# Patient Record
Sex: Male | Born: 1975 | Race: Black or African American | Hispanic: No | Marital: Married | Smoking: Current some day smoker
Health system: Southern US, Community
[De-identification: ages and names within clinical notes are randomized; demographics above are authoritative.]

---

## 2003-06-18 ENCOUNTER — Emergency Department (HOSPITAL_COMMUNITY): Admission: EM | Admit: 2003-06-18 | Discharge: 2003-06-18 | Payer: Self-pay | Admitting: Emergency Medicine

## 2006-03-17 ENCOUNTER — Emergency Department (HOSPITAL_COMMUNITY): Admission: EM | Admit: 2006-03-17 | Discharge: 2006-03-17 | Payer: Self-pay | Admitting: Emergency Medicine

## 2010-05-20 ENCOUNTER — Ambulatory Visit (HOSPITAL_COMMUNITY)
Admission: RE | Admit: 2010-05-20 | Discharge: 2010-05-20 | Payer: Self-pay | Source: Home / Self Care | Attending: Emergency Medicine | Admitting: Emergency Medicine

## 2010-05-20 ENCOUNTER — Emergency Department (HOSPITAL_COMMUNITY)
Admission: EM | Admit: 2010-05-20 | Discharge: 2010-05-20 | Payer: Self-pay | Source: Home / Self Care | Admitting: Emergency Medicine

## 2016-06-24 ENCOUNTER — Encounter: Payer: Self-pay | Admitting: Family Medicine

## 2016-06-24 ENCOUNTER — Ambulatory Visit (INDEPENDENT_AMBULATORY_CARE_PROVIDER_SITE_OTHER): Payer: No Typology Code available for payment source | Admitting: Family Medicine

## 2016-06-24 VITALS — BP 140/72 | HR 86 | Temp 98.8°F | Resp 14 | Ht 70.0 in | Wt 266.0 lb

## 2016-06-24 DIAGNOSIS — E6609 Other obesity due to excess calories: Secondary | ICD-10-CM

## 2016-06-24 DIAGNOSIS — Z720 Tobacco use: Secondary | ICD-10-CM | POA: Diagnosis not present

## 2016-06-24 DIAGNOSIS — Z0000 Encounter for general adult medical examination without abnormal findings: Secondary | ICD-10-CM | POA: Diagnosis not present

## 2016-06-24 DIAGNOSIS — E669 Obesity, unspecified: Secondary | ICD-10-CM | POA: Insufficient documentation

## 2016-06-24 DIAGNOSIS — R030 Elevated blood-pressure reading, without diagnosis of hypertension: Secondary | ICD-10-CM | POA: Diagnosis not present

## 2016-06-24 DIAGNOSIS — Z6838 Body mass index (BMI) 38.0-38.9, adult: Secondary | ICD-10-CM

## 2016-06-24 DIAGNOSIS — Z23 Encounter for immunization: Secondary | ICD-10-CM

## 2016-06-24 DIAGNOSIS — Z6839 Body mass index (BMI) 39.0-39.9, adult: Secondary | ICD-10-CM

## 2016-06-24 MED ORDER — VARENICLINE TARTRATE 0.5 MG X 11 & 1 MG X 42 PO MISC
ORAL | 0 refills | Status: DC
Start: 2016-06-24 — End: 2016-06-24

## 2016-06-24 MED ORDER — VARENICLINE TARTRATE 0.5 MG X 11 & 1 MG X 42 PO MISC: ORAL | 0 refills | Status: DC

## 2016-06-24 NOTE — Patient Instructions (Signed)
TDAP given Start chantix

## 2016-06-24 NOTE — Assessment & Plan Note (Signed)
On tobacco cessation will start Chantix he is to set a quit date within the next 3 months we will follow-up in 3 months to see how he is doing.

## 2016-06-24 NOTE — Progress Notes (Signed)
   Subjective:    Patient ID: Chad Moyer, male    DOB: Jun 22, 1975, 41 y.o.   MRN: [id number]  Patient presents for New Patient CPE (is not fasting)   Retired from [employer], now works [employer] Patient here for new patient physical. He has not been seen by Dr. about 13 years when he retired from [employer]. He has never been diagnosed with any medical problems. He does smoke which is his main concern today about 5 cigarettes a day he isn't smoking for the past 12-13 years. He's also concerned about it with his weight and he states that he eats whatever his wife brings home but is often fried food or feeds with a lot of carbs such as potatoes. He has not been exercising. He works 12 AM to 12 PM which is a difficult schedule typically 5 days a week. His family history was reviewed there is no history of cardiac disease. Only cancer history of his mother who had breast cancer.  Immunizations he is due for a tetanus booster. He declines flu shot.    Review Of Systems:  GEN- denies fatigue, fever, weight loss,weakness, recent illness HEENT- denies eye drainage, change in vision, nasal discharge, CVS- denies chest pain, palpitations RESP- denies SOB, cough, wheeze ABD- denies N/V, change in stools, abd pain GU- denies dysuria, hematuria, dribbling, incontinence MSK- denies joint pain, muscle aches, injury Neuro- denies headache, dizziness, syncope, seizure activity       Objective:    BP 140/72   Pulse 86   Temp 98.8 F (37.1 C) (Oral)   Resp 14   Ht 5\' 10"  (1.778 m)   Wt 266 lb (120.7 kg)   SpO2 96%   BMI 38.17 kg/m  GEN- NAD, alert and oriented x3 HEENT- PERRL, EOMI, non injected sclera, pink conjunctiva, MMM, oropharynx clear Neck- Supple, no thyromegaly CVS- RRR, no murmur RESP-CTAB ABD-NABS,soft,NT,ND Psych- normal affect and mood  EXT- No edema Pulses- Radial, DP- 2+        Assessment & Plan:      Problem List Items Addressed This Visit    Tobacco use    On tobacco cessation will start Chantix he is to set a quit date within the next 3 months we will follow-up in 3 months to see how he is doing.      Obese   Relevant Orders   Lipid panel   Elevated blood pressure reading    Blood pressure is little elevated we'll have to check on this at home. We also discussed the implications from his weight. His fasting labs will be done to make sure he does not have any metabolic disorders contributing to this as well. No medications will be started today.      Relevant Orders   Comprehensive metabolic panel    Other Visit Diagnoses    Routine general medical examination at a health care facility    -  Primary   CPE done, TDAP given. Fasting labs   Relevant Orders   CBC with Differential/Platelet   Comprehensive metabolic panel   Lipid panel      Note: This dictation was prepared with Dragon dictation along with smaller phrase technology. Any transcriptional errors that result from this process are unintentional.

## 2016-06-24 NOTE — Assessment & Plan Note (Signed)
Blood pressure is little elevated we'll have to check on this at home. We also discussed the implications from his weight. His fasting labs will be done to make sure he does not have any metabolic disorders contributing to this as well. No medications will be started today.

## 2016-06-25 LAB — COMPREHENSIVE METABOLIC PANEL
ALBUMIN: 4.4 g/dL (ref 3.6–5.1)
ALT: 28 U/L (ref 9–46)
AST: 25 U/L (ref 10–40)
Alkaline Phosphatase: 55 U/L (ref 40–115)
BILIRUBIN TOTAL: 0.4 mg/dL (ref 0.2–1.2)
BUN: 12 mg/dL (ref 7–25)
CHLORIDE: 105 mmol/L (ref 98–110)
CO2: 25 mmol/L (ref 20–31)
CREATININE: 1.24 mg/dL (ref 0.60–1.35)
Calcium: 9.2 mg/dL (ref 8.6–10.3)
Glucose, Bld: 102 mg/dL — ABNORMAL HIGH (ref 70–99)
Potassium: 4.2 mmol/L (ref 3.5–5.3)
SODIUM: 140 mmol/L (ref 135–146)
TOTAL PROTEIN: 6.9 g/dL (ref 6.1–8.1)

## 2016-06-25 LAB — CBC WITH DIFFERENTIAL/PLATELET
Basophils Absolute: 0 cells/uL (ref 0–200)
Basophils Relative: 0 %
EOS PCT: 2 %
Eosinophils Absolute: 148 cells/uL (ref 15–500)
HCT: 43.3 % (ref 38.5–50.0)
HEMOGLOBIN: 14.5 g/dL (ref 13.0–17.0)
LYMPHS ABS: 2368 {cells}/uL (ref 850–3900)
Lymphocytes Relative: 32 %
MCH: 28.2 pg (ref 27.0–33.0)
MCHC: 33.5 g/dL (ref 32.0–36.0)
MCV: 84.1 fL (ref 80.0–100.0)
MONOS PCT: 10 %
MPV: 10 fL (ref 7.5–12.5)
Monocytes Absolute: 740 cells/uL (ref 200–950)
NEUTROS ABS: 4144 {cells}/uL (ref 1500–7800)
Neutrophils Relative %: 56 %
PLATELETS: 353 10*3/uL (ref 140–400)
RBC: 5.15 MIL/uL (ref 4.20–5.80)
RDW: 14.9 % (ref 11.0–15.0)
WBC: 7.4 10*3/uL (ref 3.8–10.8)

## 2016-06-25 LAB — LIPID PANEL
CHOLESTEROL: 160 mg/dL (ref <200)
HDL: 38 mg/dL — ABNORMAL LOW (ref >40)
LDL CALC: 90 mg/dL (ref <100)
TRIGLYCERIDES: 159 mg/dL — AB (ref <150)
Total CHOL/HDL Ratio: 4.2 Ratio (ref <5.0)
VLDL: 32 mg/dL — AB (ref <30)

## 2016-07-01 ENCOUNTER — Encounter: Payer: Self-pay | Admitting: *Deleted

## 2016-09-23 ENCOUNTER — Ambulatory Visit: Payer: No Typology Code available for payment source | Admitting: Family Medicine

## 2016-10-04 ENCOUNTER — Encounter: Payer: Self-pay | Admitting: Family Medicine

## 2016-10-04 ENCOUNTER — Ambulatory Visit (INDEPENDENT_AMBULATORY_CARE_PROVIDER_SITE_OTHER): Payer: No Typology Code available for payment source | Admitting: Family Medicine

## 2016-10-04 VITALS — BP 126/64 | HR 66 | Temp 98.6°F | Resp 14 | Ht 70.0 in | Wt 262.0 lb

## 2016-10-04 DIAGNOSIS — Z720 Tobacco use: Secondary | ICD-10-CM | POA: Diagnosis not present

## 2016-10-04 DIAGNOSIS — R7301 Impaired fasting glucose: Secondary | ICD-10-CM | POA: Diagnosis not present

## 2016-10-04 DIAGNOSIS — E6609 Other obesity due to excess calories: Secondary | ICD-10-CM | POA: Diagnosis not present

## 2016-10-04 DIAGNOSIS — E781 Pure hyperglyceridemia: Secondary | ICD-10-CM | POA: Diagnosis not present

## 2016-10-04 DIAGNOSIS — Z6837 Body mass index (BMI) 37.0-37.9, adult: Secondary | ICD-10-CM

## 2016-10-04 LAB — LIPID PANEL
CHOL/HDL RATIO: 4 ratio (ref <5.0)
CHOLESTEROL: 153 mg/dL (ref <200)
HDL: 38 mg/dL — ABNORMAL LOW (ref >40)
LDL CALC: 78 mg/dL (ref <100)
Triglycerides: 183 mg/dL — ABNORMAL HIGH (ref <150)
VLDL: 37 mg/dL — AB (ref <30)

## 2016-10-04 LAB — COMPREHENSIVE METABOLIC PANEL
ALBUMIN: 4.5 g/dL (ref 3.6–5.1)
ALT: 33 U/L (ref 9–46)
AST: 28 U/L (ref 10–40)
Alkaline Phosphatase: 60 U/L (ref 40–115)
BILIRUBIN TOTAL: 0.3 mg/dL (ref 0.2–1.2)
BUN: 11 mg/dL (ref 7–25)
CHLORIDE: 106 mmol/L (ref 98–110)
CO2: 20 mmol/L (ref 20–31)
Calcium: 9.6 mg/dL (ref 8.6–10.3)
Creat: 1.09 mg/dL (ref 0.60–1.35)
Glucose, Bld: 93 mg/dL (ref 70–99)
POTASSIUM: 4.6 mmol/L (ref 3.5–5.3)
Sodium: 141 mmol/L (ref 135–146)
TOTAL PROTEIN: 7.1 g/dL (ref 6.1–8.1)

## 2016-10-04 MED ORDER — VARENICLINE TARTRATE 1 MG PO TABS: 1.0000 mg | ORAL_TABLET | Freq: Two times a day (BID) | ORAL | 1 refills | Status: DC

## 2016-10-04 NOTE — Assessment & Plan Note (Signed)
Recheck cholesterol and glucose Continue to work on dietary changes and exercise, healthy eating.

## 2016-10-04 NOTE — Patient Instructions (Signed)
F/U pending results   

## 2016-10-04 NOTE — Assessment & Plan Note (Signed)
Significant improvement with Chantix, given continued dose pak 1mg  BID No SE with medicaiton

## 2016-10-04 NOTE — Progress Notes (Signed)
   Subjective:    Patient ID: Chad Moyer, male    DOB: Mar 19, 1976, 41 y.o.   MRN: [id number]  Patient presents for Follow-up (is fasting)     Pt here for F/U, he established care at his visit in Feb.  Blood pressure was elevated at that visit as well as weight.   We also discussed tobacco cessation. Fasting labs at that visit showed elevated TG in setting of his diabetes, here to have that rechecked as well, fasting blood sugar was also a little elevated at 102    Today he is down 4lbs since Feb Tobacco- started on Chantix, down to 2 cig a day, wants to continue with the medication  No new concerns     Review Of Systems:  GEN- denies fatigue, fever, weight loss,weakness, recent illness HEENT- denies eye drainage, change in vision, nasal discharge, CVS- denies chest pain, palpitations RESP- denies SOB, cough, wheeze ABD- denies N/V, change in stools, abd pain GU- denies dysuria, hematuria, dribbling, incontinence MSK- denies joint pain, muscle aches, injury Neuro- denies headache, dizziness, syncope, seizure activity       Objective:    BP 126/64   Pulse 66   Temp 98.6 F (37 C) (Oral)   Resp 14   Ht 5\' 10"  (1.778 m)   Wt 262 lb (118.8 kg)   SpO2 98%   BMI 37.59 kg/m  GEN- NAD, alert and oriented x3,obese HEENT- PERRL, EOMI, non injected sclera, pink conjunctiva, MMM, oropharynx clear CVS- RRR, no murmur RESP-CTAB Pulses- Radial  2+        Assessment & Plan:      Problem List Items Addressed This Visit    Tobacco use    Significant improvement with Chantix, given continued dose pak 1mg  BID No SE with medicaiton      Obese - Primary   Hypertriglyceridemia    Recheck cholesterol and glucose Continue to work on dietary changes and exercise, healthy eating.       Relevant Orders   Comprehensive metabolic panel   Lipid panel    Other Visit Diagnoses    Elevated fasting glucose       Relevant Orders   Hemoglobin A1c      Note: This  dictation was prepared with Dragon dictation along with smaller phrase technology. Any transcriptional errors that result from this process are unintentional.

## 2016-10-05 LAB — HEMOGLOBIN A1C
Hgb A1c MFr Bld: 5.8 % — ABNORMAL HIGH (ref <5.7)
Mean Plasma Glucose: 120 mg/dL

## 2016-10-06 ENCOUNTER — Encounter: Payer: Self-pay | Admitting: *Deleted

## 2018-02-21 ENCOUNTER — Ambulatory Visit (INDEPENDENT_AMBULATORY_CARE_PROVIDER_SITE_OTHER): Payer: No Typology Code available for payment source | Admitting: Physician Assistant

## 2018-02-21 ENCOUNTER — Encounter: Payer: Self-pay | Admitting: Physician Assistant

## 2018-02-21 VITALS — BP 138/70 | HR 85 | Temp 98.4°F | Resp 18 | Ht 69.0 in | Wt 266.8 lb

## 2018-02-21 DIAGNOSIS — E6609 Other obesity due to excess calories: Secondary | ICD-10-CM

## 2018-02-21 DIAGNOSIS — R7303 Prediabetes: Secondary | ICD-10-CM | POA: Insufficient documentation

## 2018-02-21 DIAGNOSIS — E781 Pure hyperglyceridemia: Secondary | ICD-10-CM | POA: Diagnosis not present

## 2018-02-21 DIAGNOSIS — Z720 Tobacco use: Secondary | ICD-10-CM | POA: Diagnosis not present

## 2018-02-21 DIAGNOSIS — Z6839 Body mass index (BMI) 39.0-39.9, adult: Secondary | ICD-10-CM

## 2018-02-21 DIAGNOSIS — R739 Hyperglycemia, unspecified: Secondary | ICD-10-CM | POA: Diagnosis not present

## 2018-02-21 DIAGNOSIS — Z0000 Encounter for general adult medical examination without abnormal findings: Secondary | ICD-10-CM | POA: Diagnosis not present

## 2018-02-21 MED ORDER — BUPROPION HCL ER (SR) 150 MG PO TB12
ORAL_TABLET | ORAL | 2 refills | Status: DC
Start: 2018-02-21 — End: 2018-11-29

## 2018-02-21 NOTE — Progress Notes (Signed)
Patient ID: Chad Moyer MRN: [id number], DOB: 1975-11-13 42 y.o. Date of Encounter: 02/21/2018, 10:02 AM    Chief Complaint: Physical (CPE)  HPI: 41 y.o. y/o male here for CPE.     Dr. Jeanice Lim is out on maternity leave so he is scheduled to see me for his visit today. I have reviewed Dr. Deirdre Peer notes from 06/24/2016 and 10/04/2016. She saw him as a new patient to establish care 06/24/2016. Also had CPE. Prior to that visit had not seen any medical provider for about 13 years.  Issues that have been addressed at these 2 visits: Smoking, obesity, and labs revealed hypertriglyceridemia and mild hyperglycemia.  At visit 06/24/2016 they did discuss his weight and at that visit he reported that "he eats what ever his wife brings home.  Often fried foods or eats a lot of carbohydrates such as potatoes.  Not been exercising.  Asked 12 AM to 12 PM."  Also at visit she prescribed Chantix.  Today patient reports that he is still smoking.  Says that he saw no effect with Chantix.  States that it caused no adverse effects but states that he really did not see that it helped decrease smoking at all.  Is still smoking about the same amount.  Is smoking about 1/2 pack/day.  Today also reviewed that his weight is essentially the same.  Today 266.8 pounds. 06/2016 was 266. 10/04/16 was 262.  Reports that he is still working the same job and still working that same shift 12 AM to 12 PM. However he states that he is sometimes also working at 12 PM to 12 AM shift as 1 of the employees has been out some recently so he has filled in that shift some on days off.  Thing is the same.  He is still smoking.  He has not improved his diet or added any exercise.   Review of Systems: Consitutional: No fever, chills, fatigue, night sweats, lymphadenopathy, or weight changes. Eyes: No visual changes, eye redness, or discharge. ENT/Mouth: Ears: No otalgia, tinnitus, hearing loss, discharge. Nose: No  congestion, rhinorrhea, sinus pain, or epistaxis. Throat: No sore throat, post nasal drip, or teeth pain. Cardiovascular: No CP, palpitations, diaphoresis, DOE, edema, orthopnea, PND. Respiratory: No cough, hemoptysis, SOB, or wheezing. Gastrointestinal: No anorexia, dysphagia, reflux, pain, nausea, vomiting, hematemesis, diarrhea, constipation, BRBPR, or melena. Genitourinary: No dysuria, frequency, urgency, hematuria, incontinence, nocturia, decreased urinary stream, discharge, impotence, or testicular pain/masses. Musculoskeletal: No decreased ROM, myalgias, stiffness, joint swelling, or weakness. Skin: No rash, erythema, lesion changes, pain, warmth, jaundice, or pruritis. Neurological: No headache, dizziness, syncope, seizures, tremors, memory loss, coordination problems, or paresthesias. Psychological: No anxiety, depression, hallucinations, SI/HI. Endocrine: No fatigue, polydipsia, polyphagia, polyuria, or known diabetes. All other systems were reviewed and are otherwise negative.  History reviewed. No pertinent past medical history.   History reviewed. No pertinent surgical history.  Home Meds:  Outpatient Medications Prior to Visit  Medication Sig Dispense Refill  . Multiple Vitamin (MULTIVITAMIN WITH MINERALS) TABS tablet Take 1 tablet by mouth daily.    . varenicline (CHANTIX CONTINUING MONTH PAK) 1 MG tablet Take 1 tablet (1 mg total) by mouth 2 (two) times daily. 60 tablet 1   No facility-administered medications prior to visit.     Allergies: No Known Allergies  Social History   Socioeconomic History  . Marital status: Married    Spouse name: Not on file  . Number of children: Not on file  . Years of education:  Not on file  . Highest education level: Not on file  Occupational History  . Not on file  Social Needs  . Financial resource strain: Not on file  . Food insecurity:    Worry: Not on file    Inability: Not on file  . Transportation needs:    Medical: Not  on file    Non-medical: Not on file  Tobacco Use  . Smoking status: Current Every Day Smoker    Packs/day: 0.25    Years: 12.00    Pack years: 3.00    Types: Cigarettes  . Smokeless tobacco: Never Used  Substance and Sexual Activity  . Alcohol use: Yes    Comment: occasionally  . Drug use: No  . Sexual activity: Yes    Birth control/protection: None  Lifestyle  . Physical activity:    Days per week: Not on file    Minutes per session: Not on file  . Stress: Not on file  Relationships  . Social connections:    Talks on phone: Not on file    Gets together: Not on file    Attends religious service: Not on file    Active member of club or organization: Not on file    Attends meetings of clubs or organizations: Not on file    Relationship status: Not on file  . Intimate partner violence:    Fear of current or ex partner: Not on file    Emotionally abused: Not on file    Physically abused: Not on file    Forced sexual activity: Not on file  Other Topics Concern  . Not on file  Social History Narrative  . Not on file    Family History  Problem Relation Age of Onset  . Cancer Mother        breast  . Stroke Paternal Aunt     Physical Exam: Blood pressure 138/70, pulse 85, temperature 98.4 F (36.9 C), temperature source Oral, resp. rate 18, height 5\' 9"  (1.753 m), weight 121 kg, SpO2 93 %.  General: Obese AAM. Appears in no acute distress. HEENT: Normocephalic, atraumatic. Conjunctiva pink, sclera non-icteric. Pupils 2 mm constricting to 1 mm, round, regular, and equally reactive to light and accomodation. EOMI. Internal auditory canal clear. TMs with good cone of light and without pathology. Nasal mucosa pink. Nares are without discharge. No sinus tenderness. Oral mucosa pink Neck: Supple. Trachea midline. No thyromegaly. Full ROM. No lymphadenopathy. Lungs: Clear to auscultation bilaterally without wheezes, rales, or rhonchi. Breathing is of normal effort and  unlabored. Cardiovascular: RRR with S1 S2. No murmurs, rubs, or gallops. Distal pulses 2+ symmetrically. No carotid or abdominal bruits. Abdomen: Soft, non-tender, non-distended with normoactive bowel sounds. No hepatosplenomegaly or masses. No rebound/guarding. No CVA tenderness. No hernias. Musculoskeletal: Full range of motion and 5/5 strength throughout.  Skin: Warm and moist without erythema, ecchymosis, wounds, or rash. Neuro: A+Ox3. CN II-XII grossly intact. Moves all extremities spontaneously. Full sensation throughout. Normal gait. Psych:  Responds to questions appropriately with a normal affect.   Assessment/Plan:  42 y.o. y/o  male here for CPE  -1. Encounter for preventive health examination A. Screening Labs: - CBC with Differential/Platelet - COMPLETE METABOLIC PANEL WITH GFR - Lipid panel - TSH - Hemoglobin A1c   Immunizations: Flu------------------------- defers flu vaccine Tetanus------ up-to-date.  He was given Tdap here 06/24/2016 Pneumococcal------ Will discuss pneumonia vaccine at next visit.  Given his smoking really should have a Pneumovax 23.    2. Tobacco  use Today I discussed adding Wellbutrin.  He is agreeable to use this.  Hopefully this will curb his appetite and decrease cravings for smoking.  If medication causes any adverse effects then notify us.  Otherwise plan to take this for several months. - buPROPion (WELLBUTRIN SR) 150 MG 12 hr tablet; Take 1 daily for 5 days then increase to taking 1 twice a day.  Dispense: 60 tablet; Refill: 2  3. Hypertriglyceridemia - buPROPion (WELLBUTRIN SR) 150 MG 12 hr tablet; Take 1 daily for 5 days then increase to taking 1 twice a day.  Dispense: 60 tablet; Refill: 2 - COMPLETE METABOLIC PANEL WITH GFR - Lipid panel  4. Hyperglycemia - buPROPion (WELLBUTRIN SR) 150 MG 12 hr tablet; Take 1 daily for 5 days then increase to taking 1 twice a day.  Dispense: 60 tablet; Refill: 2 - COMPLETE METABOLIC PANEL WITH GFR -  Hemoglobin A1c  5. Class 2 obesity due to excess calories without serious comorbidity with body mass index (BMI) of 39.0 to 39.9 in adult - buPROPion (WELLBUTRIN SR) 150 MG 12 hr tablet; Take 1 daily for 5 days then increase to taking 1 twice a day.  Dispense: 60 tablet; Refill: 2     Immunizations: Flu Tetanus Pneumococcal Zostaax Signed:   [address] [place], [place]  02/21/2018 10:02 AM

## 2018-02-22 LAB — CBC WITH DIFFERENTIAL/PLATELET
Basophils Absolute: 29 cells/uL (ref 0–200)
Basophils Relative: 0.4 %
EOS ABS: 158 {cells}/uL (ref 15–500)
Eosinophils Relative: 2.2 %
HEMATOCRIT: 44.4 % (ref 38.5–50.0)
Hemoglobin: 15 g/dL (ref 13.2–17.1)
Lymphs Abs: 2390 cells/uL (ref 850–3900)
MCH: 27.7 pg (ref 27.0–33.0)
MCHC: 33.8 g/dL (ref 32.0–36.0)
MCV: 81.9 fL (ref 80.0–100.0)
MPV: 10.1 fL (ref 7.5–12.5)
Monocytes Relative: 9.2 %
NEUTROS PCT: 55 %
Neutro Abs: 3960 cells/uL (ref 1500–7800)
PLATELETS: 357 10*3/uL (ref 140–400)
RBC: 5.42 10*6/uL (ref 4.20–5.80)
RDW: 14.2 % (ref 11.0–15.0)
TOTAL LYMPHOCYTE: 33.2 %
WBC: 7.2 10*3/uL (ref 3.8–10.8)
WBCMIX: 662 {cells}/uL (ref 200–950)

## 2018-02-22 LAB — COMPLETE METABOLIC PANEL WITH GFR
AG Ratio: 1.8 (calc) (ref 1.0–2.5)
ALBUMIN MSPROF: 4.1 g/dL (ref 3.6–5.1)
ALKALINE PHOSPHATASE (APISO): 62 U/L (ref 40–115)
ALT: 34 U/L (ref 9–46)
AST: 28 U/L (ref 10–40)
BILIRUBIN TOTAL: 0.4 mg/dL (ref 0.2–1.2)
BUN: 10 mg/dL (ref 7–25)
CHLORIDE: 106 mmol/L (ref 98–110)
CO2: 26 mmol/L (ref 20–32)
Calcium: 9.1 mg/dL (ref 8.6–10.3)
Creat: 0.94 mg/dL (ref 0.60–1.35)
GFR, Est African American: 115 mL/min/{1.73_m2} (ref >=60)
GFR, Est Non African American: 100 mL/min/{1.73_m2} (ref >=60)
GLUCOSE: 98 mg/dL (ref 65–99)
Globulin: 2.3 g/dL (calc) (ref 1.9–3.7)
Potassium: 4 mmol/L (ref 3.5–5.3)
Sodium: 139 mmol/L (ref 135–146)
Total Protein: 6.4 g/dL (ref 6.1–8.1)

## 2018-02-22 LAB — TSH: TSH: 1.57 mIU/L (ref 0.40–4.50)

## 2018-02-22 LAB — HEMOGLOBIN A1C
HEMOGLOBIN A1C: 5.9 %{Hb} — AB (ref <5.7)
Mean Plasma Glucose: 123 (calc)
eAG (mmol/L): 6.8 (calc)

## 2018-02-22 LAB — LIPID PANEL
Cholesterol: 164 mg/dL (ref <200)
HDL: 36 mg/dL — AB (ref >40)
LDL CHOLESTEROL (CALC): 108 mg/dL — AB
Non-HDL Cholesterol (Calc): 128 mg/dL (calc) (ref <130)
Total CHOL/HDL Ratio: 4.6 (calc) (ref <5.0)
Triglycerides: 108 mg/dL (ref <150)

## 2018-11-27 ENCOUNTER — Encounter (HOSPITAL_COMMUNITY): Payer: Self-pay

## 2018-11-27 ENCOUNTER — Observation Stay (HOSPITAL_COMMUNITY)
Admission: EM | Admit: 2018-11-27 | Discharge: 2018-11-29 | Disposition: A | Discharge status: Home / Self Care | Payer: PRIVATE HEALTH INSURANCE | Attending: Internal Medicine | Admitting: Internal Medicine

## 2018-11-27 ENCOUNTER — Emergency Department (HOSPITAL_COMMUNITY): Payer: PRIVATE HEALTH INSURANCE

## 2018-11-27 ENCOUNTER — Other Ambulatory Visit: Payer: Self-pay

## 2018-11-27 DIAGNOSIS — R030 Elevated blood-pressure reading, without diagnosis of hypertension: Secondary | ICD-10-CM

## 2018-11-27 DIAGNOSIS — G4482 Headache associated with sexual activity: Secondary | ICD-10-CM | POA: Diagnosis not present

## 2018-11-27 DIAGNOSIS — R51 Headache: Principal | ICD-10-CM | POA: Insufficient documentation

## 2018-11-27 DIAGNOSIS — I776 Arteritis, unspecified: Secondary | ICD-10-CM | POA: Diagnosis not present

## 2018-11-27 DIAGNOSIS — Z823 Family history of stroke: Secondary | ICD-10-CM | POA: Diagnosis not present

## 2018-11-27 DIAGNOSIS — R519 Headache, unspecified: Secondary | ICD-10-CM | POA: Diagnosis present

## 2018-11-27 DIAGNOSIS — F17210 Nicotine dependence, cigarettes, uncomplicated: Secondary | ICD-10-CM | POA: Insufficient documentation

## 2018-11-27 DIAGNOSIS — I6601 Occlusion and stenosis of right middle cerebral artery: Secondary | ICD-10-CM | POA: Insufficient documentation

## 2018-11-27 DIAGNOSIS — G44019 Episodic cluster headache, not intractable: Secondary | ICD-10-CM

## 2018-11-27 DIAGNOSIS — E669 Obesity, unspecified: Secondary | ICD-10-CM | POA: Insufficient documentation

## 2018-11-27 DIAGNOSIS — Z1159 Encounter for screening for other viral diseases: Secondary | ICD-10-CM | POA: Insufficient documentation

## 2018-11-27 DIAGNOSIS — Z6837 Body mass index (BMI) 37.0-37.9, adult: Secondary | ICD-10-CM | POA: Insufficient documentation

## 2018-11-27 LAB — BASIC METABOLIC PANEL
Anion gap: 9 (ref 5–15)
BUN: 12 mg/dL (ref 6–20)
CO2: 23 mmol/L (ref 22–32)
Calcium: 9.1 mg/dL (ref 8.9–10.3)
Chloride: 105 mmol/L (ref 98–111)
Creatinine, Ser: 1.18 mg/dL (ref 0.61–1.24)
GFR calc Af Amer: >60 mL/min (ref >60)
GFR calc non Af Amer: >60 mL/min (ref >60)
Glucose, Bld: 102 mg/dL — ABNORMAL HIGH (ref 70–99)
Potassium: 3.9 mmol/L (ref 3.5–5.1)
Sodium: 137 mmol/L (ref 135–145)

## 2018-11-27 LAB — RAPID URINE DRUG SCREEN, HOSP PERFORMED
Amphetamines: NOT DETECTED
Barbiturates: NOT DETECTED
Benzodiazepines: NOT DETECTED
Cocaine: NOT DETECTED
Opiates: NOT DETECTED
Tetrahydrocannabinol: NOT DETECTED

## 2018-11-27 LAB — CBC WITH DIFFERENTIAL/PLATELET
Abs Immature Granulocytes: 0.02 10*3/uL (ref 0.00–0.07)
Basophils Absolute: 0.1 10*3/uL (ref 0.0–0.1)
Basophils Relative: 1 %
Eosinophils Absolute: 0.1 10*3/uL (ref 0.0–0.5)
Eosinophils Relative: 1 %
HCT: 47.2 % (ref 39.0–52.0)
Hemoglobin: 15.7 g/dL (ref 13.0–17.0)
Immature Granulocytes: 0 %
Lymphocytes Relative: 27 %
Lymphs Abs: 2.4 10*3/uL (ref 0.7–4.0)
MCH: 28.2 pg (ref 26.0–34.0)
MCHC: 33.3 g/dL (ref 30.0–36.0)
MCV: 84.9 fL (ref 80.0–100.0)
Monocytes Absolute: 0.8 10*3/uL (ref 0.1–1.0)
Monocytes Relative: 9 %
Neutro Abs: 5.8 10*3/uL (ref 1.7–7.7)
Neutrophils Relative %: 62 %
Platelets: 404 10*3/uL — ABNORMAL HIGH (ref 150–400)
RBC: 5.56 MIL/uL (ref 4.22–5.81)
RDW: 14.2 % (ref 11.5–15.5)
WBC: 9.2 10*3/uL (ref 4.0–10.5)
nRBC: 0 % (ref 0.0–0.2)

## 2018-11-27 LAB — SARS CORONAVIRUS 2 BY RT PCR (HOSPITAL ORDER, PERFORMED IN ~~LOC~~ HOSPITAL LAB): SARS Coronavirus 2: NEGATIVE

## 2018-11-27 MED ORDER — IOHEXOL 350 MG/ML SOLN
75.0000 mL | Freq: Once | INTRAVENOUS | Status: AC | PRN
  Administered 2018-11-27: 75 mL via INTRAVENOUS

## 2018-11-27 MED ORDER — MAGNESIUM SULFATE 2 GM/50ML IV SOLN
2.0000 g | Freq: Once | INTRAVENOUS | Status: AC
  Administered 2018-11-27: 2 g via INTRAVENOUS
  Filled 2018-11-27: qty 50

## 2018-11-27 MED ORDER — HYDRALAZINE HCL 20 MG/ML IJ SOLN: 10.0000 mg | INTRAMUSCULAR | Status: DC | PRN

## 2018-11-27 NOTE — ED Triage Notes (Signed)
Pt reports headache intermittent since July 9th. Denies any other symptoms. Pt alert, states his pain is about a 1/10.

## 2018-11-27 NOTE — Consult Note (Addendum)
NEURO HOSPITALIST CONSULT NOTE   Requestig physician: Dr. Vanita Panda  Reason for Consult: CTA showing severe multifocal intracranial arterial narrowing following 3rd episode of severe headache  History obtained from:   Patient and Chart     HPI:                                                                                                                                          Chad Moyer is an 43 y.o. male who presented to the ED after having his 3rd severe headache in one week. First headache occurred during coitus on July 9th. Second headache occurred while walking on about the 10th. Today, he was doing nothing in particular when the 3rd severe headache occurred. CTA obtained in the ED showed no subarachnoid blood or aneurysm, but was positive for severe multifocal intracranial arterial narrowing.  Headache was 1/10 on arrival to the ED. He has received IV magnesium sulfate; of note, the headache was resolved prior to initiation of the Mg infusion.   He denies illicit drug use.   He denies weakness, numbness, vision changes, facial droop, difficulty with speech, SOB, CP or any other neurological symptoms except headache, which is now resolved.   History reviewed. No pertinent past medical history.  History reviewed. No pertinent surgical history.  Family History  Problem Relation Age of Onset  . Cancer Mother        breast  . Stroke Paternal Aunt               Social History:  reports that he has been smoking cigarettes. He has a 3.00 pack-year smoking history. He has never used smokeless tobacco. He reports current alcohol use. He reports that he does not use drugs.  No Known Allergies  MEDICATIONS:                                                                                                                     Wellbutrin Tylenol Advil MVI   ROS:  Denies    Blood pressure 137/81, pulse 66, temperature 99.6 F (37.6 C), temperature source Oral, resp. rate 16, SpO2 99 %.   General Examination:                                                                                                       Physical Exam  HEENT-  [place]/AT    Lungs- Respirations unlabored Extremities- No edema  Neurological Examination Mental Status:  Alert, oriented, thought content appropriate.  Speech fluent without evidence of aphasia.  Able to follow all commands without difficulty. Cranial Nerves: II: Visual fields intact with no extinction to DSS. PERRL III,IV, VI: No ptosis. EOMI.  V,VII: No facial droop. Temp sensation equal bilaterally VIII: hearing intact to conversation IX,X: Palate rises symmetrically XI: Symmetric shoulder shrug XII: midline tongue extension Motor: Right : Upper extremity   5/5    Left:     Upper extremity   5/5  Lower extremity   5/5     Lower extremity   5/5 No pronator drift.  Sensory: Temp and light touch intact throughout, bilaterally. No extinction.  Deep Tendon Reflexes: 1+ and symmetric throughout Cerebellar: No ataxia with FNF bilaterally Gait: Deferred   Lab Results: Basic Metabolic Panel: Recent Labs  Lab 11/27/18 2012  NA 137  K 3.9  CL 105  CO2 23  GLUCOSE 102*  BUN 12  CREATININE 1.18  CALCIUM 9.1    CBC: Recent Labs  Lab 11/27/18 2012  WBC 9.2  NEUTROABS 5.8  HGB 15.7  HCT 47.2  MCV 84.9  PLT 404*    Cardiac Enzymes: No results for input(s): CKTOTAL, CKMB, CKMBINDEX, TROPONINI in the last 168 hours.  Lipid Panel: No results for input(s): CHOL, TRIG, HDL, CHOLHDL, VLDL, LDLCALC in the last 168 hours.  Imaging: Ct Angio Head W Or Wo Contrast  Result Date: 11/27/2018 CLINICAL DATA:  Intermittent headaches since 11/22/2018. EXAM: CT ANGIOGRAPHY HEAD TECHNIQUE: Multidetector CT imaging of the head was performed using the standard protocol during  bolus administration of intravenous contrast. Multiplanar CT image reconstructions and MIPs were obtained to evaluate the vascular anatomy. CONTRAST:  75mL OMNIPAQUE IOHEXOL 350 MG/ML SOLN COMPARISON:  None. FINDINGS: CT HEAD Brain: There is no evidence of acute infarct, intracranial hemorrhage, mass, midline shift, or extra-axial fluid collection. The ventricles and sulci are normal. Vascular: No hyperdense vessel. Skull: No fracture or focal osseous lesion. Sinuses: Visualized paranasal sinuses and mastoid air cells are clear. Orbits: Unremarkable. CTA HEAD Anterior circulation: The internal carotid arteries are widely patent from skull base to carotid termini. ACAs and MCAs are patent without evidence of proximal branch occlusion, however there is the appearance of widespread branch vessel irregularity and narrowing including severe bilateral A1 and A2 stenoses. MCA branch vessel stenoses are slightly greater on the right than the left, and there is mild right M1 narrowing. No aneurysm is identified. Posterior circulation: The visualized distal vertebral arteries are widely patent to the basilar with the left being moderately dominant. Patent PICA and SCA origins are seen bilaterally. The basilar artery is widely  patent. There is a small right posterior communicating artery. A left posterior communicating artery is not clearly identified and may be diminutive or absent. The PCAs are patent with mild P1 narrowing bilaterally as well as prominent attenuation and irregularity of both PCAs more distally. No aneurysm is identified. Venous sinuses: Patent. Anatomic variants: None. IMPRESSION: 1. 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 2. No large vessel occlusion or aneurysm. 3. Unremarkable noncontrast CT appearance of the brain. Electronically Signed   By: Sebastian AcheAllen  Grady M.D.   On: 11/27/2018 21:47     Assessment: 43 year old male presenting with 3rd episode of thunderclap headache associated with sexual intercourse 1. Headache now improved to 0/10 2. No neurological deficits symptomatically or on exam 3. 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   Recommendations: 1. MRI brain 2. Start scheduled dosing of nimodipine, which can lower frequency and severity of attacks in cased of RCVS. Ordering a 2 day course.  3. Urine toxicology screen 4. May benefit from 4-vessel catheter-based cerebral angiogram in the morning to further assess for RCVS versus vasculitis. May benefit from verapamil IA injection between two of the runs on the angiogram to determine if vasoconstriction is reversible.  5. Vasculitis panel  6. Avoid sexual intercourse, Valsalva maneuver and exertion for 4-6 weeks 7. BP management with SBP goal of < 160 8. Hold Wellbutrin (serotonergic medications can trigger RCVS) 9. May need outpatient LP 10. Has completed infusion of magnesium sulfate IV.   Electronically signed: Dr. Caryl PinaEric Adline Kirshenbaum 11/27/2018, 10:20 PM

## 2018-11-27 NOTE — H&P (Signed)
History and Physical    Chad Moyer JOA:[id number]:[id number] DOB: 1975/10/20 DOA: 11/27/2018  PCP: Chad Moyer, Chad F, MD  Patient coming from: Home.  Chief Complaint: Headache.  HPI: Chad Moyer is a 43 y.o. male with no significant past medical history has been experiencing headache over the last few days.  First episode happened after sexual intercourse on July 9 which lasted for almost 4 hours and resolved without any intervention.  Was severe and mostly global over the head.  No neck pain no visual symptoms or any focal deficit.  2 days later patient had a similar headache while walking and lasted for few hours.  Again it happened today around afternoon lasted for 4 hours and by the time patient reached ER resolved.  All the headaches were severe in nature resolved without any intervention.  ED Course: In the ER patient had CT angiogram of the head which shows features concerning for vasculitis.  On-call neurology was consulted and admitted for further management.  Patient was afebrile and on exam appeared nonfocal.  Lab work showed WBC 9.2 hemoglobin 15 platelets 404 creatinine 1 and urine drug screen was negative.  COVID-19 was negative.  Only one reading of blood pressure was systolic 164 rest of them are less than 150.  Review of Systems: As per HPI, rest all negative.   History reviewed. No pertinent past medical history.  History reviewed. No pertinent surgical history.   reports that he has been smoking cigarettes. He has a 3.00 pack-year smoking history. He has never used smokeless tobacco. He reports current alcohol use. He reports that he does not use drugs.  No Known Allergies  Family History  Problem Relation Age of Onset  . Cancer Mother        breast  . Stroke Paternal Aunt     Prior to Admission medications   Medication Sig Start Date End Date Taking? Authorizing Provider  acetaminophen (TYLENOL) 500 MG tablet Take 1,000 mg by mouth every 6 (six) hours as  needed for headache.   Yes [provider]  ibuprofen (ADVIL) 200 MG tablet Take 400 mg by mouth every 6 (six) hours as needed for headache.   Yes [provider]  Multiple Vitamin (MULTIVITAMIN WITH MINERALS) TABS tablet Take 1 tablet by mouth daily.   Yes [provider]  buPROPion (WELLBUTRIN SR) 150 MG 12 hr tablet Take 1 daily for 5 days then increase to taking 1 twice a day. Patient not taking: Reported on 11/27/2018 02/21/18   Chad Moyer, Chad B, PA-C    Physical Exam: Constitutional: Moderately built and nourished. Vitals:   11/27/18 1859 11/27/18 2111 11/27/18 2217  BP: (!) 142/76 (!) 164/81 137/81  Pulse: 82 61 66  Resp: 14 19 16   Temp: 99.6 Moyer (37.6 C)    TempSrc: Oral    SpO2: 100% 96% 99%   Eyes: Anicteric no pallor. ENMT: No discharge from the ears eyes nose or mouth. Neck: No mass or.  No neck rigidity. Respiratory: No rhonchi or crepitations. Cardiovascular: S1-S2 heard. Abdomen: Soft nontender bowel sounds present. Musculoskeletal: No edema. Skin: No rash. Neurologic: Alert awake oriented to time place and person.  Moves all extremities. Psychiatric: Appears normal per normal affect.   Labs on Admission: I have personally reviewed following labs and imaging studies  CBC: Recent Labs  Lab 11/27/18 2012  WBC 9.2  NEUTROABS 5.8  HGB 15.7  HCT 47.2  MCV 84.9  PLT 404*   Basic Metabolic Panel: Recent  Labs  Lab 11/27/18 2012  NA 137  K 3.9  CL 105  CO2 23  GLUCOSE 102*  BUN 12  CREATININE 1.18  CALCIUM 9.1   GFR: CrCl cannot be calculated (Unknown ideal weight.). Liver Function Tests: No results for input(s): AST, ALT, ALKPHOS, BILITOT, PROT, ALBUMIN in the last 168 hours. No results for input(s): LIPASE, AMYLASE in the last 168 hours. No results for input(s): AMMONIA in the last 168 hours. Coagulation Profile: No results for input(s): INR, PROTIME in the last 168 hours. Cardiac Enzymes: No results for input(s): CKTOTAL,  CKMB, CKMBINDEX, TROPONINI in the last 168 hours. BNP (last 3 results) No results for input(s): PROBNP in the last 8760 hours. HbA1C: No results for input(s): HGBA1C in the last 72 hours. CBG: No results for input(s): GLUCAP in the last 168 hours. Lipid Profile: No results for input(s): CHOL, HDL, LDLCALC, TRIG, CHOLHDL, LDLDIRECT in the last 72 hours. Thyroid Function Tests: No results for input(s): TSH, T4TOTAL, FREET4, T3FREE, THYROIDAB in the last 72 hours. Anemia Panel: No results for input(s): VITAMINB12, FOLATE, FERRITIN, TIBC, IRON, RETICCTPCT in the last 72 hours. Urine analysis: No results found for: COLORURINE, APPEARANCEUR, LABSPEC, PHURINE, GLUCOSEU, HGBUR, BILIRUBINUR, KETONESUR, PROTEINUR, UROBILINOGEN, NITRITE, LEUKOCYTESUR Sepsis Labs: @LABRCNTIP (procalcitonin:4,lacticidven:4) )No results found for this or any previous visit (from the past 240 hour(s)).   Radiological Exams on Admission: Ct Angio Head W Or Wo Contrast  Result Date: 11/27/2018 CLINICAL DATA:  Intermittent headaches since 11/22/2018. EXAM: CT ANGIOGRAPHY HEAD TECHNIQUE: Multidetector CT imaging of the head was performed using the standard protocol during bolus administration of intravenous contrast. Multiplanar CT image reconstructions and MIPs were obtained to evaluate the vascular anatomy. CONTRAST:  93mL OMNIPAQUE IOHEXOL 350 MG/ML SOLN COMPARISON:  None. FINDINGS: CT HEAD Brain: There is no evidence of acute infarct, intracranial hemorrhage, mass, midline shift, or extra-axial fluid collection. The ventricles and sulci are normal. Vascular: No hyperdense vessel. Skull: No fracture or focal osseous lesion. Sinuses: Visualized paranasal sinuses and mastoid air cells are clear. Orbits: Unremarkable. CTA HEAD Anterior circulation: The internal carotid arteries are widely patent from skull base to carotid termini. ACAs and MCAs are patent without evidence of proximal branch occlusion, however there is the  appearance of widespread branch vessel irregularity and narrowing including severe bilateral A1 and A2 stenoses. MCA branch vessel stenoses are slightly greater on the right than the left, and there is mild right M1 narrowing. No aneurysm is identified. Posterior circulation: The visualized distal vertebral arteries are widely patent to the basilar with the left being moderately dominant. Patent PICA and SCA origins are seen bilaterally. The basilar artery is widely patent. There is a small right posterior communicating artery. A left posterior communicating artery is not clearly identified and may be diminutive or absent. The PCAs are patent with mild P1 narrowing bilaterally as well as prominent attenuation and irregularity of both PCAs more distally. No aneurysm is identified. Venous sinuses: Patent. Anatomic variants: None. IMPRESSION: 1. 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 2. No large vessel occlusion or aneurysm. 3. Unremarkable noncontrast CT appearance of the brain. Electronically Signed   By: Logan Bores M.D.   On: 11/27/2018 21:47      Assessment/Plan Principal Problem:   Headache    1. Severe headache episodes -appreciate neurology consult discussed with neurologist.  At this time differentials include RCVS versus vasculitis.  MRI brain vasculitis panel has  been ordered.  Patient was started on nimodipine.  To keep blood pressure systolic less than 160.  Lumbar puncture may be needed as outpatient.  May need angiogram.  To avoid any exertion for next 4 to 6 weeks. 2. History of tobacco abuse quit about a month ago after taking Wellbutrin.  Does not take Wellbutrin for last 1 month.   DVT prophylaxis: SCDs in anticipation of procedure. Code Status: Full code. Family Communication: Discussed with patient. Disposition Plan: Home. Consults called: Neurology.  Admission status: Observation.   Eduard ClosArshad N Obdulia Steier MD [facility] Pager [phone].  If 7PM-7AM, please contact night-coverage [email or web address] Password [email or web address]  11/27/2018, 11:28 PM

## 2018-11-27 NOTE — ED Provider Notes (Signed)
[facility]   CSN: [id number] Arrival date & time: 11/27/18  1842  History   Chief Complaint Chief Complaint  Patient presents with   Headache   HPI Chad Moyer is a 43 y.o. male with past medical history significant for obesity, hyperglycemia, hypertriglyceridemia, tobacco abuse, hypertension who presents for evaluation of headache. Patient states he has had intermittent headaches which occur sexual intercourse over the last week.  Patient states these have occurred 3 times this week, last today first episode on 11/19/18. Patient states pain will occur in the middle of sexual intercourse and last 1 to 2 hours after intercourse. He has been taking Aleve which resolves his symptoms. He denies sudden onset thunderclap headache however states headaches severe in nature.  Patient rates his current head pain a 1/10. Denies unilateral weakness, blurred vision, facial asymmetry, dysphasia, slurred speech, chest pain, shortness of breath, neck pain or neck stiffness. No hx of prior HA. Denies additional aggravating or alleviating factors. Does not want anything for pain at this time. No illicit drug use.  History obtained from patient and past medical records. No interpretor was used.  [facility]    HPI  History reviewed. No pertinent past medical history.  Patient Active Problem List   Diagnosis Date Noted   Headache 11/27/2018   Hyperglycemia 02/21/2018   Hypertriglyceridemia 10/04/2016   Class 2 obesity with body mass index (BMI) of 39.0 to 39.9 in adult 06/24/2016   Tobacco use 06/24/2016    History reviewed. No pertinent surgical history.      Home Medications    Prior to Admission medications   Medication Sig Start Date End Date Taking? Authorizing Provider  acetaminophen (TYLENOL) 500 MG tablet Take 1,000 mg by mouth every 6 (six) hours as needed for headache.   Yes [provider]  ibuprofen (ADVIL) 200 MG tablet Take 400 mg by mouth every 6 (six) hours as needed for headache.   Yes [provider]  Multiple Vitamin (MULTIVITAMIN WITH MINERALS) TABS tablet Take 1 tablet by mouth daily.   Yes [provider]  buPROPion (WELLBUTRIN SR) 150 MG 12 hr tablet Take 1 daily for 5 days then increase to taking 1 twice a day. Patient not taking: Reported on 11/27/2018 02/21/18   [name], [name], PA-C    Family History Family History  Problem Relation Age of Onset   Cancer Mother        breast   Stroke Paternal Aunt     Social History Social History   Tobacco Use   Smoking status: Current Every Day Smoker    Packs/day: 0.25    Years: 12.00    Pack years: 3.00    Types: Cigarettes   Smokeless tobacco: Never Used  Substance Use Topics   Alcohol use: Yes    Comment: occasionally   Drug use: No     Allergies   Patient has no known allergies.   Review of Systems Review of Systems  Constitutional: Negative.   HENT: Negative.   Respiratory: Negative.   Cardiovascular: Negative.   Gastrointestinal: Negative.   Genitourinary: Negative.   Musculoskeletal: Negative.   Skin: Negative.   Neurological: Positive for headaches. Negative for dizziness, tremors, seizures, syncope, facial asymmetry, speech difficulty, weakness, light-headedness and numbness.  All other systems reviewed and are negative.  Physical Exam Updated Vital Signs BP 137/81 (BP Location: Left Arm)    Pulse 66    Temp 99.6  F (37.6 C) (Oral)    Resp 16    SpO2 99%   Physical Exam  Physical Exam  Constitutional: Pt is oriented to person, place, and time. Pt appears well-developed and well-nourished. No distress.  HENT:  Head: Normocephalic and atraumatic.  Mouth/Throat: Oropharynx is clear and moist.  Eyes: Conjunctivae and EOM are normal. Pupils are equal, round, and reactive to light. No scleral icterus.  No horizontal, vertical or rotational nystagmus  Neck:  Normal range of motion. Neck supple.  Full active and passive ROM without pain No midline or paraspinal tenderness No nuchal rigidity or meningeal signs  Cardiovascular: Normal rate, regular rhythm and intact distal pulses.   Pulmonary/Chest: Effort normal and breath sounds normal. No respiratory distress. Pt has no wheezes. No rales.  Abdominal: Soft. Bowel sounds are normal. There is no tenderness. There is no rebound and no guarding.  Musculoskeletal: Normal range of motion.  Lymphadenopathy:    No cervical adenopathy.  Neurological: Pt. is alert and oriented to person, place, and time. He has normal reflexes. No cranial nerve deficit.  Exhibits normal muscle tone. Coordination normal.  Mental Status:  Alert, oriented, thought content appropriate. Speech fluent without evidence of aphasia. Able to follow 2 step commands without difficulty.  Cranial Nerves:  II:  Peripheral visual fields grossly normal, pupils equal, round, reactive to light III,IV, VI: ptosis not present, extra-ocular motions intact bilaterally  V,VII: smile symmetric, facial light touch sensation equal VIII: hearing grossly normal bilaterally  IX,X: midline uvula rise  XI: bilateral shoulder shrug equal and strong XII: midline tongue extension  Motor:  5/5 in upper and lower extremities bilaterally including strong and equal grip strength and dorsiflexion/plantar flexion Sensory: Pinprick and light touch normal in all extremities.  Deep Tendon Reflexes: 2+ and symmetric  Cerebellar: normal finger-to-nose with bilateral upper extremities Gait: normal gait and balance CV: distal pulses palpable throughout   Skin: Skin is warm and dry. No rash noted. Pt is not diaphoretic.  Psychiatric: Pt has a normal mood and affect. Behavior is normal. Judgment and thought content normal.  Nursing note and vitals reviewed. ED Treatments / Results  Labs (all labs ordered are listed, but only abnormal results are displayed) Labs  Reviewed  CBC WITH DIFFERENTIAL/PLATELET - Abnormal; Notable for the following components:      Result Value   Platelets 404 (*)    All other components within normal limits  BASIC METABOLIC PANEL - Abnormal; Notable for the following components:   Glucose, Bld 102 (*)    All other components within normal limits  SARS CORONAVIRUS 2 (HOSPITAL ORDER, [facility])  RAPID URINE DRUG SCREEN, HOSP PERFORMED   EKG None  Radiology Ct Angio Head W Or Wo Contrast  Result Date: 11/27/2018 CLINICAL DATA:  Intermittent headaches since 11/22/2018. EXAM: CT ANGIOGRAPHY HEAD TECHNIQUE: Multidetector CT imaging of the head was performed using the standard protocol during bolus administration of intravenous contrast. Multiplanar CT image reconstructions and MIPs were obtained to evaluate the vascular anatomy. CONTRAST:  74mL OMNIPAQUE IOHEXOL 350 MG/ML SOLN COMPARISON:  None. FINDINGS: CT HEAD Brain: There is no evidence of acute infarct, intracranial hemorrhage, mass, midline shift, or extra-axial fluid collection. The ventricles and sulci are normal. Vascular: No hyperdense vessel. Skull: No fracture or focal osseous lesion. Sinuses: Visualized paranasal sinuses and mastoid air cells are clear. Orbits: Unremarkable. CTA HEAD Anterior circulation: The internal carotid arteries are widely patent from skull base to carotid termini. ACAs and MCAs  are patent without evidence of proximal branch occlusion, however there is the appearance of widespread branch vessel irregularity and narrowing including severe bilateral A1 and A2 stenoses. MCA branch vessel stenoses are slightly greater on the right than the left, and there is mild right M1 narrowing. No aneurysm is identified. Posterior circulation: The visualized distal vertebral arteries are widely patent to the basilar with the left being moderately dominant. Patent PICA and SCA origins are seen bilaterally. The basilar artery is widely  patent. There is a small right posterior communicating artery. A left posterior communicating artery is not clearly identified and may be diminutive or absent. The PCAs are patent with mild P1 narrowing bilaterally as well as prominent attenuation and irregularity of both PCAs more distally. No aneurysm is identified. Venous sinuses: Patent. Anatomic variants: None. IMPRESSION: 1. 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 2. No large vessel occlusion or aneurysm. 3. Unremarkable noncontrast CT appearance of the brain. Electronically Signed   By: Sebastian AcheAllen  Grady M.D.   On: 11/27/2018 21:47    Procedures Procedures (including critical care time)  Medications Ordered in ED Medications  magnesium sulfate IVPB 2 g 50 mL (2 g Intravenous New Bag/Given 11/27/18 2250)  hydrALAZINE (APRESOLINE) injection 10 mg (has no administration in time range)  iohexol (OMNIPAQUE) 350 MG/ML injection 75 mL (75 mLs Intravenous Contrast Given 11/27/18 2105)   Initial Impression / Assessment and Plan / ED Course  I have reviewed the triage vital signs and the nursing notes.  Pertinent labs & imaging results that were available during my care of the patient were reviewed by me and considered in my medical decision making (see chart for details).   43 year old male appears otherwise well presents for evaluation of headache.  He is afebrile, nonseptic, non-ill-appearing.  Patient with 3 episodes of sudden onset headache associated with sexual activity.  No prior history of headaches.  He has a normal neurologic exam without neurologic deficits.  Headaches last 1 to 2 hours and self resolved.  States these are "severe" in nature.  Concern for possible brain aneurysm.  Will obtain CTA as well as basic labs.  Patient does not want anything for his headache at this time.  Labs and imaging personally reviewed.   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  2025: Consulted with Dr. Otelia LimesLindzen, Neuro hospitalist who recommends inpatient admission for MRI brain without contrast, blood pressure control and he will evaluate patient.  Requesting hospitalist admission.  Dr. Otelia LimesLindzen states patient may have reversible cerebral vasoconstrictive syndrome.  Will need work-up for this. No LP given time of events.  Recommends mag sulfate for bp control and CCB. Needs dc off welbutrin as can contribute to narrowing. No exertional activity. Also recommends UDS as cocaine can contribute to symptoms however patient denies use.  2036: Consulted with Pharmacist [place] who recommends 2 mg IV Mag and switch to PO in the morning. Recommends Cardizem if he needs further BP control. Current BP 137/81. Will hold on CCB for hospitalist.  2052: Consulted with Dr. Toniann FailKakrakandy who will evaluate patient for admission.  Discussed plan with patient.  All questions answered.    The patient appears reasonably stabilized for admission considering the current resources, flow, and capabilities available in the ED at this time, and [facility] screening and/or treatment in the ED prior to admission.  Patient was discussed with Dr. Jeraldine LootsLockwood, attending physician who agrees with above treatment and plan. 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

## 2018-11-27 NOTE — ED Notes (Signed)
ED TO INPATIENT HANDOFF REPORT  ED Nurse Name and Phone #: Magnus IvanLouie, RN [phone]  S Name/Age/Gender Chad Moyer 43 y.o. male Room/Bed: 002C/002C  Code Status   Code Status: Not on file  Home/SNF/Other Home Patient oriented to: situation Is this baseline? Yes  Triage Complete: Triage complete  Chief Complaint Headache  Triage Note Pt reports headache intermittent since July 9th. Denies any other symptoms. Pt alert, states his pain is about a 1/10.   Allergies No Known Allergies  Level of Care/Admitting Diagnosis ED Disposition    None      B Medical/Surgery History History reviewed. No pertinent past medical history. History reviewed. No pertinent surgical history.   A IV Location/Drains/Wounds Patient Lines/Drains/Airways Status   Active Line/Drains/Airways    Name:   Placement date:   Placement time:   Site:   Days:   Peripheral IV 11/27/18 Right Antecubital   11/27/18    2020    Antecubital   less than 1          Intake/Output Last 24 hours  Intake/Output Summary (Last 24 hours) at 11/27/2018 2222 Last data filed at 11/27/2018 2000 Gross per 24 hour  Intake 0 ml  Output 0 ml  Net 0 ml    Labs/Imaging Results for orders placed or performed during the hospital encounter of 11/27/18 (from the past 48 hour(s))  CBC with Differential     Status: Abnormal   Collection Time: 11/27/18  8:12 PM  Result Value Ref Range   WBC 9.2 4.0 - 10.5 K/uL   RBC 5.56 4.22 - 5.81 MIL/uL   Hemoglobin 15.7 13.0 - 17.0 g/dL   HCT 45.447.2 09.839.0 - 11.952.0 %   MCV 84.9 80.0 - 100.0 fL   MCH 28.2 26.0 - 34.0 pg   MCHC 33.3 30.0 - 36.0 g/dL   RDW 14.714.2 82.911.5 - 56.215.5 %   Platelets 404 (H) 150 - 400 K/uL   nRBC 0.0 0.0 - 0.2 %   Neutrophils Relative % 62 %   Neutro Abs 5.8 1.7 - 7.7 K/uL   Lymphocytes Relative 27 %   Lymphs Abs 2.4 0.7 - 4.0 K/uL   Monocytes Relative 9 %   Monocytes Absolute 0.8 0.1 - 1.0 K/uL   Eosinophils Relative 1 %   Eosinophils Absolute 0.1 0.0 -  0.5 K/uL   Basophils Relative 1 %   Basophils Absolute 0.1 0.0 - 0.1 K/uL   Immature Granulocytes 0 %   Abs Immature Granulocytes 0.02 0.00 - 0.07 K/uL    Comment: Performed at [facility], [address] [place], [place] [address]  Basic metabolic panel     Status: Abnormal   Collection Time: 11/27/18  8:12 PM  Result Value Ref Range   Sodium 137 135 - 145 mmol/L   Potassium 3.9 3.5 - 5.1 mmol/L   Chloride 105 98 - 111 mmol/L   CO2 23 22 - 32 mmol/L   Glucose, Bld 102 (H) 70 - 99 mg/dL   BUN 12 6 - 20 mg/dL   Creatinine, Ser 5.781.18 0.61 - 1.24 mg/dL   Calcium 9.1 8.9 - 46.910.3 mg/dL   GFR calc non Af Amer >60 >60 mL/min   GFR calc Af Amer >60 >60 mL/min   Anion gap 9 5 - 15    Comment: Performed at [facility], [address] [place], [place] [address]   Ct Angio Head W Or Wo Contrast  Result Date: 11/27/2018 CLINICAL DATA:  Intermittent headaches since 11/22/2018. EXAM: CT ANGIOGRAPHY HEAD TECHNIQUE: Multidetector CT imaging of the head was performed using the standard protocol during bolus administration of intravenous contrast. Multiplanar CT image reconstructions and MIPs were obtained to evaluate the vascular anatomy. CONTRAST:  77mL OMNIPAQUE IOHEXOL 350 MG/ML SOLN COMPARISON:  None. FINDINGS: CT HEAD Brain: There is no evidence of acute infarct, intracranial hemorrhage, mass, midline shift, or extra-axial fluid collection. The ventricles and sulci are normal. Vascular: No hyperdense vessel. Skull: No fracture or focal osseous lesion. Sinuses: Visualized paranasal sinuses and mastoid air cells are clear. Orbits: Unremarkable. CTA HEAD Anterior circulation: The internal carotid arteries are widely patent from skull base to carotid termini. ACAs and MCAs are patent without evidence of proximal branch occlusion, however there is the appearance of widespread branch vessel irregularity and narrowing including severe bilateral A1 and A2 stenoses. MCA branch vessel stenoses are  slightly greater on the right than the left, and there is mild right M1 narrowing. No aneurysm is identified. Posterior circulation: The visualized distal vertebral arteries are widely patent to the basilar with the left being moderately dominant. Patent PICA and SCA origins are seen bilaterally. The basilar artery is widely patent. There is a small right posterior communicating artery. A left posterior communicating artery is not clearly identified and may be diminutive or absent. The PCAs are patent with mild P1 narrowing bilaterally as well as prominent attenuation and irregularity of both PCAs more distally. No aneurysm is identified. Venous sinuses: Patent. Anatomic variants: None. IMPRESSION: 1. 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 2. No large vessel occlusion or aneurysm. 3. Unremarkable noncontrast CT appearance of the brain. Electronically Signed   By: Logan Bores M.D.   On: 11/27/2018 21:47    Pending Labs Unresulted Labs (From admission, onward)    Start     Ordered   11/27/18 2214  Rapid urine drug screen (hospital performed)  ONCE - STAT,   STAT     11/27/18 2213          Vitals/Pain Today's Vitals   11/27/18 1859 11/27/18 1905 11/27/18 2111 11/27/18 2217  BP: (!) 142/76  (!) 164/81 137/81  Pulse: 82  61 66  Resp: 14  19 16   Temp: 99.6 F (37.6 C)     TempSrc: Oral     SpO2: 100%  96% 99%  PainSc:  1       Isolation Precautions No active isolations  Medications Medications  iohexol (OMNIPAQUE) 350 MG/ML injection 75 mL (75 mLs Intravenous Contrast Given 11/27/18 2105)    Mobility walks Low fall risk   Focused Assessments Neuro Assessment Handoff:  Swallow screen pass? Yes  Cardiac Rhythm: Normal sinus rhythm       Neuro Assessment: Within Defined Limits(c/o headache) Neuro Checks:      Last Documented NIHSS Modified Score:   Has TPA  been given? No If patient is a Neuro Trauma and patient is going to OR before floor call report to [place] nurse: [phone] or [phone]     R Recommendations: See Admitting Provider Note  Report given to:   Additional Notes:

## 2018-11-28 ENCOUNTER — Encounter (HOSPITAL_COMMUNITY): Payer: Self-pay | Admitting: Radiology

## 2018-11-28 ENCOUNTER — Observation Stay (HOSPITAL_COMMUNITY): Payer: PRIVATE HEALTH INSURANCE

## 2018-11-28 ENCOUNTER — Observation Stay (HOSPITAL_BASED_OUTPATIENT_CLINIC_OR_DEPARTMENT_OTHER): Payer: PRIVATE HEALTH INSURANCE

## 2018-11-28 DIAGNOSIS — G459 Transient cerebral ischemic attack, unspecified: Secondary | ICD-10-CM

## 2018-11-28 DIAGNOSIS — R51 Headache: Secondary | ICD-10-CM | POA: Diagnosis not present

## 2018-11-28 DIAGNOSIS — I67841 Reversible cerebrovascular vasoconstriction syndrome: Secondary | ICD-10-CM

## 2018-11-28 DIAGNOSIS — G4482 Headache associated with sexual activity: Secondary | ICD-10-CM | POA: Diagnosis not present

## 2018-11-28 DIAGNOSIS — I776 Arteritis, unspecified: Secondary | ICD-10-CM | POA: Diagnosis not present

## 2018-11-28 LAB — SEDIMENTATION RATE: Sed Rate: 1 mm/hr (ref 0–16)

## 2018-11-28 LAB — LIPID PANEL
Cholesterol: 152 mg/dL (ref 0–200)
HDL: 39 mg/dL — ABNORMAL LOW (ref >40)
LDL Cholesterol: 88 mg/dL (ref 0–99)
Total CHOL/HDL Ratio: 3.9 RATIO
Triglycerides: 123 mg/dL (ref <150)
VLDL: 25 mg/dL (ref 0–40)

## 2018-11-28 LAB — CBC
HCT: 47.4 % (ref 39.0–52.0)
Hemoglobin: 15.3 g/dL (ref 13.0–17.0)
MCH: 27.6 pg (ref 26.0–34.0)
MCHC: 32.3 g/dL (ref 30.0–36.0)
MCV: 85.6 fL (ref 80.0–100.0)
Platelets: 417 10*3/uL — ABNORMAL HIGH (ref 150–400)
RBC: 5.54 MIL/uL (ref 4.22–5.81)
RDW: 14.3 % (ref 11.5–15.5)
WBC: 9.3 10*3/uL (ref 4.0–10.5)
nRBC: 0 % (ref 0.0–0.2)

## 2018-11-28 LAB — C-REACTIVE PROTEIN: CRP: 0.8 mg/dL (ref <1.0)

## 2018-11-28 LAB — PROTIME-INR
INR: 1 (ref 0.8–1.2)
Prothrombin Time: 13 seconds (ref 11.4–15.2)

## 2018-11-28 LAB — BASIC METABOLIC PANEL
Anion gap: 10 (ref 5–15)
BUN: 10 mg/dL (ref 6–20)
CO2: 24 mmol/L (ref 22–32)
Calcium: 9 mg/dL (ref 8.9–10.3)
Chloride: 105 mmol/L (ref 98–111)
Creatinine, Ser: 0.97 mg/dL (ref 0.61–1.24)
GFR calc Af Amer: >60 mL/min (ref >60)
GFR calc non Af Amer: >60 mL/min (ref >60)
Glucose, Bld: 94 mg/dL (ref 70–99)
Potassium: 3.8 mmol/L (ref 3.5–5.1)
Sodium: 139 mmol/L (ref 135–145)

## 2018-11-28 LAB — ECHOCARDIOGRAM COMPLETE
Height: 70 in
Weight: 4179.92 oz

## 2018-11-28 LAB — HEMOGLOBIN A1C
Hgb A1c MFr Bld: 6.2 % — ABNORMAL HIGH (ref 4.8–5.6)
Mean Plasma Glucose: 131.24 mg/dL

## 2018-11-28 LAB — HIV ANTIBODY (ROUTINE TESTING W REFLEX): HIV Screen 4th Generation wRfx: NONREACTIVE

## 2018-11-28 MED ORDER — NIMODIPINE 30 MG PO CAPS
30.0000 mg | ORAL_CAPSULE | ORAL | Status: DC
  Administered 2018-11-28 – 2018-11-29 (×7): 30 mg via ORAL
  Filled 2018-11-28 (×12): qty 1

## 2018-11-28 MED ORDER — ONDANSETRON HCL 4 MG PO TABS: 4.0000 mg | ORAL_TABLET | Freq: Four times a day (QID) | ORAL | Status: DC | PRN

## 2018-11-28 MED ORDER — ASPIRIN EC 81 MG PO TBEC
81.0000 mg | DELAYED_RELEASE_TABLET | Freq: Every day | ORAL | Status: DC
  Administered 2018-11-28 – 2018-11-29 (×2): 81 mg via ORAL
  Filled 2018-11-28 (×2): qty 1

## 2018-11-28 MED ORDER — ACETAMINOPHEN 650 MG RE SUPP: 650.0000 mg | Freq: Four times a day (QID) | RECTAL | Status: DC | PRN

## 2018-11-28 MED ORDER — ONDANSETRON HCL 4 MG/2ML IJ SOLN: 4.0000 mg | Freq: Four times a day (QID) | INTRAMUSCULAR | Status: DC | PRN

## 2018-11-28 MED ORDER — SODIUM CHLORIDE 0.9 % IV SOLN
INTRAVENOUS | Status: AC
  Administered 2018-11-28 (×2): via INTRAVENOUS

## 2018-11-28 MED ORDER — ACETAMINOPHEN 325 MG PO TABS: 650.0000 mg | ORAL_TABLET | Freq: Four times a day (QID) | ORAL | Status: DC | PRN

## 2018-11-28 MED ORDER — SODIUM CHLORIDE 0.9 % IV SOLN
INTRAVENOUS | Status: AC | PRN
  Administered 2018-11-28: 10 mL/h via INTRAVENOUS

## 2018-11-28 NOTE — Progress Notes (Signed)
2D Echocardiogram has been performed.  Chad Moyer 11/28/2018, 10:40 AM

## 2018-11-28 NOTE — Progress Notes (Signed)
TCD completed. Refer to "CV Proc" under chart review to view preliminary results.  11/28/2018 2:14 PM Maudry Mayhew, MHA, RVT, RDCS, RDMS

## 2018-11-28 NOTE — Progress Notes (Signed)
Progress Note    Chad Moyer  ZOX:[id number] DOB: Apr 30, 1976  DOA: 11/27/2018 PCP: [name], [name], MD    Brief Narrative:    Medical records reviewed and are as summarized below:  Chad Moyer is an 43 y.o. male  with no significant past medical history has been experiencing headache over the last few days.  First episode happened after sexual intercourse on July 9 which lasted for almost 4 hours and resolved without any intervention.  Was severe and mostly global over the head.  No neck pain no visual symptoms or any focal deficit.  2 days later patient had a similar headache while walking and lasted for few hours.  Again it happened today around afternoon lasted for 4 hours and by the time patient reached ER resolved.  All the headaches were severe in nature resolved without any intervention.  Assessment/Plan:   Principal Problem:   Headache  Severe headache episodes  -appreciate neurology consult discussed with neurologist.   -CTA showing cerebral artery stenosis -MRI brain normal -goal blood pressure systolic less than 160.  -plan is for cerebral arteriogram History of tobacco abuse -quit a month ago  obesity Body mass index is 37.48 kg/m.   Family Communication/Anticipated D/C date and plan/Code Status   DVT prophylaxis:  Code Status: Full Code.  Family Communication:  Disposition Plan: pending cerebral anteriogram   Medical Consultants:    Neuro  IR     Subjective:   No SOB, no CP No headache  Objective:    Vitals:   11/27/18 2217 11/28/18 0053 11/28/18 0337 11/28/18 0746  BP: 137/81 (!) 174/107 117/72 (!) 151/82  Pulse: 66 (!) 56 (!) 52 (!) 53  Resp: 16 18 18 18   Temp:  97.7 F (36.5 C) 98.1 F (36.7 C) 98.9 F (37.2 C)  TempSrc:  Oral Oral Oral  SpO2: 99% 100% 100% 96%  Weight:  118.5 kg    Height:  5\' 10"  (1.778 m)      Intake/Output Summary (Last 24 hours) at 11/28/2018 1111 Last data filed at 11/27/2018 2000  Gross per 24 hour  Intake 0 ml  Output 0 ml  Net 0 ml   Filed Weights   11/28/18 0053  Weight: 118.5 kg    Exam: In bed, NAD Age appropriate rrr No increased work of breathing-- wearing mask No LE edema  Data Reviewed:   I have personally reviewed following labs and imaging studies:  Labs: Labs show the following:   Basic Metabolic Panel: Recent Labs  Lab 11/27/18 2012 11/28/18 0339  NA 137 139  K 3.9 3.8  CL 105 105  CO2 23 24  GLUCOSE 102* 94  BUN 12 10  CREATININE 1.18 0.97  CALCIUM 9.1 9.0   GFR Estimated Creatinine Clearance: 126.7 mL/min (by C-G formula based on SCr of 0.97 mg/dL). Liver Function Tests: No results for input(s): AST, ALT, ALKPHOS, BILITOT, PROT, ALBUMIN in the last 168 hours. No results for input(s): LIPASE, AMYLASE in the last 168 hours. No results for input(s): AMMONIA in the last 168 hours. Coagulation profile Recent Labs  Lab 11/28/18 1008  INR 1.0    CBC: Recent Labs  Lab 11/27/18 2012 11/28/18 0339  WBC 9.2 9.3  NEUTROABS 5.8  --   HGB 15.7 15.3  HCT 47.2 47.4  MCV 84.9 85.6  PLT 404* 417*   Cardiac Enzymes: No results for input(s): CKTOTAL, CKMB, CKMBINDEX, TROPONINI in the last 168 hours. BNP (last 3 results) No  results for input(s): PROBNP in the last 8760 hours. CBG: No results for input(s): GLUCAP in the last 168 hours. D-Dimer: No results for input(s): DDIMER in the last 72 hours. Hgb A1c: Recent Labs    11/28/18 0825  HGBA1C 6.2*   Lipid Profile: Recent Labs    11/28/18 0825  CHOL 152  HDL 39*  LDLCALC 88  TRIG 132123  CHOLHDL 3.9   Thyroid function studies: No results for input(s): TSH, T4TOTAL, T3FREE, THYROIDAB in the last 72 hours.  Invalid input(s): FREET3 Anemia work up: No results for input(s): VITAMINB12, FOLATE, FERRITIN, TIBC, IRON, RETICCTPCT in the last 72 hours. Sepsis Labs: Recent Labs  Lab 11/27/18 2012 11/28/18 0339  WBC 9.2 9.3    Microbiology Recent Results (from  the past 240 hour(s))  SARS Coronavirus 2 (CEPHEID - Performed in [facility] hospital lab), Hosp Order     Status: None   Collection Time: 11/27/18 10:37 PM   Specimen: Nasopharyngeal Swab  Result Value Ref Range Status   SARS Coronavirus 2 NEGATIVE NEGATIVE Final    Comment: (NOTE) If result is NEGATIVE SARS-CoV-2 target nucleic acids are NOT DETECTED. The SARS-CoV-2 RNA is generally detectable in upper and lower  respiratory specimens during the acute phase of infection. The lowest  concentration of SARS-CoV-2 viral copies this assay can detect is 250  copies / mL. A negative result does not preclude SARS-CoV-2 infection  and should not be used as the sole basis for treatment or other  patient management decisions.  A negative result may occur with  improper specimen collection / handling, submission of specimen other  than nasopharyngeal swab, presence of viral mutation(s) within the  areas targeted by this assay, and inadequate number of viral copies  (<250 copies / mL). A negative result must be combined with clinical  observations, patient history, and epidemiological information. If result is POSITIVE SARS-CoV-2 target nucleic acids are DETECTED. The SARS-CoV-2 RNA is generally detectable in upper and lower  respiratory specimens dur ing the acute phase of infection.  Positive  results are indicative of active infection with SARS-CoV-2.  Clinical  correlation with patient history and other diagnostic information is  necessary to determine patient infection status.  Positive results do  not rule out bacterial infection or co-infection with other viruses. If result is PRESUMPTIVE POSTIVE SARS-CoV-2 nucleic acids MAY BE PRESENT.   A presumptive positive result was obtained on the submitted specimen  and confirmed on repeat testing.  While 2019 novel coronavirus  (SARS-CoV-2) nucleic acids may be present in the submitted sample  additional confirmatory testing may be necessary  for epidemiological  and / or clinical management purposes  to differentiate between  SARS-CoV-2 and other Sarbecovirus currently known to infect humans.  If clinically indicated additional testing with an alternate test  methodology [phone]([id number]) is advised. The SARS-CoV-2 RNA is generally  detectable in upper and lower respiratory sp ecimens during the acute  phase of infection. The expected result is Negative. Fact Sheet for Patients:  BoilerBrush.com.cyhttps://www.fda.gov/media/136312/download Fact Sheet for Healthcare Providers: https://pope.com/https://www.fda.gov/media/136313/download This test is not yet approved or cleared by the [place] FDA and has been authorized for detection and/or diagnosis of SARS-CoV-2 by FDA under an Emergency Use Authorization (EUA).  This EUA will remain in effect (meaning this test can be used) for the duration of the COVID-19 declaration under Section 564(b)(1) of the Act, 21 U.S.C. section 360bbb-3(b)(1), unless the authorization is terminated or revoked sooner. Performed at [facility], [address] [place], [place] [address]     Procedures and diagnostic studies:  Ct Angio Head W Or Wo Contrast  Result Date: 11/27/2018 CLINICAL DATA:  Intermittent headaches since 11/22/2018. EXAM: CT ANGIOGRAPHY HEAD TECHNIQUE: Multidetector CT imaging of the head was performed using the standard protocol during bolus administration of intravenous contrast. Multiplanar CT image reconstructions and MIPs were obtained to evaluate the vascular anatomy. CONTRAST:  32mL OMNIPAQUE IOHEXOL 350 MG/ML SOLN COMPARISON:  None. FINDINGS: CT HEAD Brain: There is no evidence of acute infarct, intracranial hemorrhage, mass, midline shift, or extra-axial fluid collection. The ventricles and sulci are normal. Vascular: No hyperdense vessel. Skull: No fracture or focal osseous lesion. Sinuses: Visualized paranasal sinuses and mastoid air cells are clear. Orbits: Unremarkable. CTA HEAD Anterior  circulation: The internal carotid arteries are widely patent from skull base to carotid termini. ACAs and MCAs are patent without evidence of proximal branch occlusion, however there is the appearance of widespread branch vessel irregularity and narrowing including severe bilateral A1 and A2 stenoses. MCA branch vessel stenoses are slightly greater on the right than the left, and there is mild right M1 narrowing. No aneurysm is identified. Posterior circulation: The visualized distal vertebral arteries are widely patent to the basilar with the left being moderately dominant. Patent PICA and SCA origins are seen bilaterally. The basilar artery is widely patent. There is a small right posterior communicating artery. A left posterior communicating artery is not clearly identified and may be diminutive or absent. The PCAs are patent with mild P1 narrowing bilaterally as well as prominent attenuation and irregularity of both PCAs more distally. No aneurysm is identified. Venous sinuses: Patent. Anatomic variants: None. IMPRESSION: 1. 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 2. No large vessel occlusion or aneurysm. 3. Unremarkable noncontrast CT appearance of the brain. Electronically Signed   By: Logan Bores M.D.   On: 11/27/2018 21:47   Mr Brain Wo Contrast  Result Date: 11/28/2018 CLINICAL DATA:  Acute headache EXAM: MRI HEAD WITHOUT CONTRAST TECHNIQUE: Multiplanar, multiecho pulse sequences of the brain and surrounding structures were obtained without intravenous contrast. COMPARISON:  Head CTA from yesterday FINDINGS: Brain: No acute or remote infarction, hemorrhage, hydrocephalus, extra-axial collection or mass lesion. No white matter disease or atrophy Vascular: See preceding CTA Skull and upper cervical spine: Negative for marrow lesion. Sinuses/Orbits: Negative IMPRESSION: Negative brain  MRI. Electronically Signed   By: Monte Fantasia M.D.   On: 11/28/2018 06:47    Medications:   . niMODipine  30 mg Oral Q4H   Continuous Infusions: . sodium chloride 75 mL/hr at 11/28/18 0428     LOS: 0 days   Geradine Girt  [facility]   How to contact the [facility] Attending or Consulting provider [place] or covering provider during after hours [place], for this patient?  1. Check the care team in [facility] and look for a) attending/consulting [facility] provider listed and b) the [facility] team listed 2. Log into [email or web address] and use [employer] universal password to access. If you do not have the password, please contact the hospital operator. 3. Locate the [facility] provider you are looking for under [facility] and page to a number that you can be directly reached. 4. If you still have difficulty reaching the provider, please page the [facility] (Director on Call) for the Hospitalists listed on amion for assistance.  11/28/2018, 11:11 AM

## 2018-11-28 NOTE — Progress Notes (Signed)
STROKE TEAM PROGRESS NOTE   INTERVAL HISTORY Pt lying in bed, comfortably. Asymptomatic. Pt stated that his first HA at right vertex, pressure like 10/10 lasted about 12h. The second and third HA about 5/10, lasted about 4 hours. Now asymptomatic. However, CTA head concerning for multifocal intracranial stenosis. Pt denies any HTN, DM or HLD.   Vitals:   11/28/18 0053 11/28/18 0337 11/28/18 0746 11/28/18 1205  BP: (!) 174/107 117/72 (!) 151/82 129/77  Pulse: (!) 56 (!) 52 (!) 53 62  Resp: 18 18 18 18   Temp: 97.7 F (36.5 C) 98.1 F (36.7 C) 98.9 F (37.2 C) 98.9 F (37.2 C)  TempSrc: Oral Oral Oral Oral  SpO2: 100% 100% 96% 100%  Weight: 118.5 kg     Height: 5\' 10"  (1.778 m)       CBC:  Recent Labs  Lab 11/27/18 2012 11/28/18 0339  WBC 9.2 9.3  NEUTROABS 5.8  --   HGB 15.7 15.3  HCT 47.2 47.4  MCV 84.9 85.6  PLT 404* 417*    Basic Metabolic Panel:  Recent Labs  Lab 11/27/18 2012 11/28/18 0339  NA 137 139  K 3.9 3.8  CL 105 105  CO2 23 24  GLUCOSE 102* 94  BUN 12 10  CREATININE 1.18 0.97  CALCIUM 9.1 9.0   Lipid Panel:     Component Value Date/Time   CHOL 152 11/28/2018 0825   TRIG 123 11/28/2018 0825   HDL 39 (L) 11/28/2018 0825   CHOLHDL 3.9 11/28/2018 0825   VLDL 25 11/28/2018 0825   LDLCALC 88 11/28/2018 0825   LDLCALC 108 (H) 02/21/2018 1004   HgbA1c:  Lab Results  Component Value Date   HGBA1C 6.2 (H) 11/28/2018   Urine Drug Screen:     Component Value Date/Time   LABOPIA NONE DETECTED 11/27/2018 2214   COCAINSCRNUR NONE DETECTED 11/27/2018 2214   LABBENZ NONE DETECTED 11/27/2018 2214   AMPHETMU NONE DETECTED 11/27/2018 2214   THCU NONE DETECTED 11/27/2018 2214   LABBARB NONE DETECTED 11/27/2018 2214    Alcohol Level No results found for: ETH  IMAGING Ct Angio Head W Or Wo Contrast  Result Date: 11/27/2018 CLINICAL DATA:  Intermittent headaches since 11/22/2018. EXAM: CT ANGIOGRAPHY HEAD TECHNIQUE: Multidetector CT imaging of the  head was performed using the standard protocol during bolus administration of intravenous contrast. Multiplanar CT image reconstructions and MIPs were obtained to evaluate the vascular anatomy. CONTRAST:  75mL OMNIPAQUE IOHEXOL 350 MG/ML SOLN COMPARISON:  None. FINDINGS: CT HEAD Brain: There is no evidence of acute infarct, intracranial hemorrhage, mass, midline shift, or extra-axial fluid collection. The ventricles and sulci are normal. Vascular: No hyperdense vessel. Skull: No fracture or focal osseous lesion. Sinuses: Visualized paranasal sinuses and mastoid air cells are clear. Orbits: Unremarkable. CTA HEAD Anterior circulation: The internal carotid arteries are widely patent from skull base to carotid termini. ACAs and MCAs are patent without evidence of proximal branch occlusion, however there is the appearance of widespread branch vessel irregularity and narrowing including severe bilateral A1 and A2 stenoses. MCA branch vessel stenoses are slightly greater on the right than the left, and there is mild right M1 narrowing. No aneurysm is identified. Posterior circulation: The visualized distal vertebral arteries are widely patent to the basilar with the left being moderately dominant. Patent PICA and SCA origins are seen bilaterally. The basilar artery is widely patent. There is a small right posterior communicating artery. A left posterior communicating artery is not clearly identified and may  be diminutive or absent. The PCAs are patent with mild P1 narrowing bilaterally as well as prominent attenuation and irregularity of both PCAs more distally. No aneurysm is identified. Venous sinuses: Patent. Anatomic variants: None. IMPRESSION: 1. 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 2. No large vessel occlusion or aneurysm. 3. Unremarkable noncontrast CT appearance of the brain. Electronically  Signed   By: Sebastian AcheAllen  Grady M.D.   On: 11/27/2018 21:47   Mr Brain Wo Contrast  Result Date: 11/28/2018 CLINICAL DATA:  Acute headache EXAM: MRI HEAD WITHOUT CONTRAST TECHNIQUE: Multiplanar, multiecho pulse sequences of the brain and surrounding structures were obtained without intravenous contrast. COMPARISON:  Head CTA from yesterday FINDINGS: Brain: No acute or remote infarction, hemorrhage, hydrocephalus, extra-axial collection or mass lesion. No white matter disease or atrophy Vascular: See preceding CTA Skull and upper cervical spine: Negative for marrow lesion. Sinuses/Orbits: Negative IMPRESSION: Negative brain MRI. Electronically Signed   By: Marnee SpringJonathon  Watts M.D.   On: 11/28/2018 06:47   [place] Transcranial Doppler  Result Date: 11/28/2018  Transcranial Doppler Indications: Stroke. Performing Technologist: Gertie FeyMichelle Simonetti MHA, RDMS, RVT, RDCS  Examination Guidelines: A complete evaluation includes B-mode imaging, spectral Doppler, color Doppler, and power Doppler as needed of all accessible portions of each vessel. Bilateral testing is considered an integral part of a complete examination. Limited examinations for reoccurring indications may be performed as noted.  +----------+-------------+----------+-----------+------------------+ RIGHT TCD Right VM (cm)Depth (cm)Pulsatility     Comment       +----------+-------------+----------+-----------+------------------+ MCA           72.00                 0.89                       +----------+-------------+----------+-----------+------------------+ ACA                                         Unable to insonate +----------+-------------+----------+-----------+------------------+ Term ICA      58.00                 0.94                       +----------+-------------+----------+-----------+------------------+ PCA                                         Unable to insonate  +----------+-------------+----------+-----------+------------------+ Opthalmic     17.00                 1.18                       +----------+-------------+----------+-----------+------------------+ ICA siphon    43.00                 1.19                       +----------+-------------+----------+-----------+------------------+ Vertebral                                   Unable to insonate +----------+-------------+----------+-----------+------------------+  +----------+------------+----------+-----------+------------------+ LEFT TCD  Left VM (cm)Depth (cm)Pulsatility     Comment       +----------+------------+----------+-----------+------------------+ MCA          69.00                 1.04                       +----------+------------+----------+-----------+------------------+ ACA          -41.00                1.09                       +----------+------------+----------+-----------+------------------+ Term ICA     71.00                 0.96                       +----------+------------+----------+-----------+------------------+ PCA          15.00                 0.86                       +----------+------------+----------+-----------+------------------+ Opthalmic    17.00                 1.32                       +----------+------------+----------+-----------+------------------+ ICA siphon   19.00                 1.32                       +----------+------------+----------+-----------+------------------+ Vertebral                                  Unable to insonate +----------+------------+----------+-----------+------------------+  +------------+-------+------------------+             VM cm/s     Comment       +------------+-------+------------------+ Prox Basilar       Unable to insonate +------------+-------+------------------+    Preliminary     PHYSICAL EXAM  Temp:  [97.7 F (36.5 C)-99.6 F (37.6 C)] 98.7 F (37.1  C) (07/15 1648) Pulse Rate:  [52-82] 80 (07/15 1648) Resp:  [14-19] 16 (07/15 1648) BP: (117-174)/(72-107) 128/73 (07/15 1648) SpO2:  [95 %-100 %] 95 % (07/15 1648) Weight:  [118.5 kg] 118.5 kg (07/15 0053)  General - Well nourished, well developed, in no apparent distress.  Ophthalmologic - fundi not visualized due to noncooperation.  Cardiovascular - Regular rate and rhythm.  Mental Status -  Level of arousal and orientation to time, place, and person were intact. Language including expression, naming, repetition, comprehension was assessed and found intact. Attention span and concentration were normal. Recent and remote memory were intact. Fund of Knowledge was assessed and was intact.  Cranial Nerves II - XII - II - Visual field intact OU. III, IV, VI - Extraocular movements intact. V - Facial sensation intact bilaterally. VII - Facial movement intact bilaterally. VIII - Hearing & vestibular intact bilaterally. X - Palate elevates symmetrically. XI - Chin turning & shoulder shrug intact bilaterally. XII - Tongue protrusion intact.  Motor Strength - The patient's strength was normal in all extremities and pronator drift was absent.  Bulk was normal and  fasciculations were absent   Motor Tone - Muscle tone was assessed at the neck and appendages and was normal.  Reflexes - The patient's reflexes were symmetrical in all extremities and he had no pathological reflexes.  Sensory - Light touch, temperature/pinprick were assessed and were symmetrical.    Coordination - The patient had normal movements in the hands and feet with no ataxia or dysmetria.  Tremor was absent.  Gait and Station - deferred.   ASSESSMENT/PLAN Mr. Otho KetGeoffrey L Jennette is a 43 y.o. male with no significant past medical history presenting with 3rd severe headache in 1 week.   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  No LVO.  MRI  Unremarkable   Cerebral angiogram  pending  2D Echo EF 55-60%. No source of embolus   TCD no vasospasm   May consider LP after angio if needed  LDL 88  HgbA1c 6.2  Autoimmune lab pending  HIV neg  RPR pending  SCDs for VTE prophylaxis  No antithrombotic prior to admission, now on ASA 81mg .  Dr. Otelia LimesLindzen recommended nimodipine 30mg  Q4h for 48 hours  Disposition:  Return home  Tobacco abuse  Current smoker  Smoking cessation counseling provided  quit 1 mo ago after taking Wellbutrin. Has not taken Wellbutrin for the past month  Pt is willing to quit  Other Stroke Risk Factors  ETOH use, advised to drink no more than 2 drink(s) a day  Obesity, Body mass index is 37.48 kg/m., recommend weight loss, diet and exercise as appropriate   Family hx stroke (paternal aunt)  Hospital day # 0  Marvel PlanJindong Kierrah Kilbride, MD PhD Stroke Neurology 11/28/2018 5:30 PM    To contact Stroke Continuity provider, please refer to [email or web address]. After hours, contact General Neurology

## 2018-11-28 NOTE — Consult Note (Signed)
Chief Complaint: Patient was seen in consultation today for cerebral arteriogram Chief Complaint  Patient presents with  . Headache   at the request of Dr Agnes LawrenceE Lindzen   Supervising Physician: Julieanne Cottoneveshwar, Sanjeev  [facility]  History of Present Illness: Chad Moyer is a 43 y.o. male   3 severe headaches in 1 week First during intercourse -  July 9 2nd walking around - July 11 3rd-- just sitting - July 14 All lasting few hrs- resolved on own Came to ED 7/14 pm  Denies N/V; located top of head Denies vision or speech changes +smoker  CTA: IMPRESSION: 1. 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 2. No large vessel occlusion or aneurysm. 3. Unremarkable noncontrast CT appearance of the brain  IMPRESSION: Negative brain MRI.  Has been seen by Neuro-- asking for cerebral arteriogram Dr Corliss Skainseveshwar has reviewed imaging and chart-- approves procedure  History reviewed. No pertinent past medical history.  History reviewed. No pertinent surgical history.  Allergies: Patient has no known allergies.  Medications: Prior to Admission medications   Medication Sig Start Date End Date Taking? Authorizing Provider  acetaminophen (TYLENOL) 500 MG tablet Take 1,000 mg by mouth every 6 (six) hours as needed for headache.   Yes [provider]  ibuprofen (ADVIL) 200 MG tablet Take 400 mg by mouth every 6 (six) hours as needed for headache.   Yes [provider]  Multiple Vitamin (MULTIVITAMIN WITH MINERALS) TABS tablet Take 1 tablet by mouth daily.   Yes [provider]  buPROPion (WELLBUTRIN SR) 150 MG 12 hr tablet Take 1 daily for 5 days then increase to taking 1 twice a day. Patient not taking: Reported on 11/27/2018 02/21/18   [name], [name], PA-C     Family History  Problem Relation Age of Onset  . Cancer  Mother        breast  . Stroke Paternal Aunt     Social History   Socioeconomic History  . Marital status: Married    Spouse name: Not on file  . Number of children: Not on file  . Years of education: Not on file  . Highest education level: Not on file  Occupational History  . Not on file  Social Needs  . Financial resource strain: Not on file  . Food insecurity    Worry: Not on file    Inability: Not on file  . Transportation needs    Medical: Not on file    Non-medical: Not on file  Tobacco Use  . Smoking status: Current Every Day Smoker    Packs/day: 0.25    Years: 12.00    Pack years: 3.00    Types: Cigarettes  . Smokeless tobacco: Never Used  Substance and Sexual Activity  . Alcohol use: Yes    Comment: occasionally  . Drug use: No  . Sexual activity: Yes    Birth control/protection: None  Lifestyle  . Physical activity    Days per week: Not on file    Minutes per session: Not on file  . Stress: Not on file  Relationships  . Social Musicianconnections    Talks on phone: Not on file    Gets together: Not on file    Attends religious service: Not on file    Active member of club or organization: Not on file    Attends meetings of clubs or  organizations: Not on file    Relationship status: Not on file  Other Topics Concern  . Not on file  Social History Narrative  . Not on file     Review of Systems: A 12 point ROS discussed and pertinent positives are indicated in the HPI above.  All other systems are negative.  Review of Systems  Constitutional: Negative for activity change, appetite change, fatigue and fever.  HENT: Negative for nosebleeds, tinnitus and trouble swallowing.   Eyes: Negative for visual disturbance.  Respiratory: Negative for cough and shortness of breath.   Cardiovascular: Negative for chest pain.  Gastrointestinal: Negative for abdominal pain.  Musculoskeletal: Negative for back pain and neck pain.  Neurological: Positive for headaches.  Negative for dizziness, tremors, seizures, syncope, facial asymmetry, speech difficulty, weakness, light-headedness and numbness.  Psychiatric/Behavioral: Negative for behavioral problems and confusion.    Vital Signs: BP (!) 151/82 (BP Location: Left Arm)   Pulse (!) 53   Temp 98.9 F (37.2 C) (Oral)   Resp 18   Ht 5\' 10"  (1.778 m)   Wt 261 lb 3.9 oz (118.5 kg)   SpO2 96%   BMI 37.48 kg/m   Physical Exam Vitals signs reviewed.  HENT:     Head: Atraumatic.  Eyes:     Extraocular Movements: Extraocular movements intact.  Neck:     Musculoskeletal: Normal range of motion.  Cardiovascular:     Rate and Rhythm: Normal rate and regular rhythm.     Heart sounds: Normal heart sounds.  Pulmonary:     Effort: Pulmonary effort is normal.     Breath sounds: Normal breath sounds.  Abdominal:     General: Bowel sounds are normal.  Musculoskeletal: Normal range of motion.  Skin:    General: Skin is warm and dry.  Neurological:     Mental Status: He is alert.  Psychiatric:        Mood and Affect: Mood normal.        Speech: Speech normal.        Behavior: Behavior normal.     Imaging: Ct Angio Head W Or Wo Contrast  Result Date: 11/27/2018 CLINICAL DATA:  Intermittent headaches since 11/22/2018. EXAM: CT ANGIOGRAPHY HEAD TECHNIQUE: Multidetector CT imaging of the head was performed using the standard protocol during bolus administration of intravenous contrast. Multiplanar CT image reconstructions and MIPs were obtained to evaluate the vascular anatomy. CONTRAST:  75mL OMNIPAQUE IOHEXOL 350 MG/ML SOLN COMPARISON:  None. FINDINGS: CT HEAD Brain: There is no evidence of acute infarct, intracranial hemorrhage, mass, midline shift, or extra-axial fluid collection. The ventricles and sulci are normal. Vascular: No hyperdense vessel. Skull: No fracture or focal osseous lesion. Sinuses: Visualized paranasal sinuses and mastoid air cells are clear. Orbits: Unremarkable. CTA HEAD Anterior  circulation: The internal carotid arteries are widely patent from skull base to carotid termini. ACAs and MCAs are patent without evidence of proximal branch occlusion, however there is the appearance of widespread branch vessel irregularity and narrowing including severe bilateral A1 and A2 stenoses. MCA branch vessel stenoses are slightly greater on the right than the left, and there is mild right M1 narrowing. No aneurysm is identified. Posterior circulation: The visualized distal vertebral arteries are widely patent to the basilar with the left being moderately dominant. Patent PICA and SCA origins are seen bilaterally. The basilar artery is widely patent. There is a small right posterior communicating artery. A left posterior communicating artery is not clearly identified and may be diminutive or  absent. The PCAs are patent with mild P1 narrowing bilaterally as well as prominent attenuation and irregularity of both PCAs more distally. No aneurysm is identified. Venous sinuses: Patent. Anatomic variants: None. IMPRESSION: 1. 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 2. No large vessel occlusion or aneurysm. 3. Unremarkable noncontrast CT appearance of the brain. Electronically Signed   By: Sebastian AcheAllen  Grady M.D.   On: 11/27/2018 21:47   Mr Brain Wo Contrast  Result Date: 11/28/2018 CLINICAL DATA:  Acute headache EXAM: MRI HEAD WITHOUT CONTRAST TECHNIQUE: Multiplanar, multiecho pulse sequences of the brain and surrounding structures were obtained without intravenous contrast. COMPARISON:  Head CTA from yesterday FINDINGS: Brain: No acute or remote infarction, hemorrhage, hydrocephalus, extra-axial collection or mass lesion. No white matter disease or atrophy Vascular: See preceding CTA Skull and upper cervical spine: Negative for marrow lesion. Sinuses/Orbits: Negative IMPRESSION: Negative brain  MRI. Electronically Signed   By: Marnee SpringJonathon  Watts M.D.   On: 11/28/2018 06:47    Labs:  CBC: Recent Labs    02/21/18 1004 11/27/18 2012 11/28/18 0339  WBC 7.2 9.2 9.3  HGB 15.0 15.7 15.3  HCT 44.4 47.2 47.4  PLT 357 404* 417*    COAGS: No results for input(s): INR, APTT in the last 8760 hours.  BMP: Recent Labs    02/21/18 1004 11/27/18 2012 11/28/18 0339  NA 139 137 139  K 4.0 3.9 3.8  CL 106 105 105  CO2 26 23 24   GLUCOSE 98 102* 94  BUN 10 12 10   CALCIUM 9.1 9.1 9.0  CREATININE 0.94 1.18 0.97  GFRNONAA 100 >60 >60  GFRAA 115 >60 >60    LIVER FUNCTION TESTS: Recent Labs    02/21/18 1004  BILITOT 0.4  AST 28  ALT 34  PROT 6.4    TUMOR MARKERS: No results for input(s): AFPTM, CEA, CA199, CHROMGRNA in the last 8760 hours.  Assessment and Plan:  New headaches X 3 in a week CTA showing cerebral artery stenosis Scheduled for cerebral arteriogram today Risks and benefits of cerebral angiogram with intervention were discussed with the patient including, but not limited to bleeding, infection, vascular injury, contrast induced renal failure, stroke or even death.  This interventional procedure involves the use of X-rays and because of the nature of the planned procedure, it is possible that we will have prolonged use of X-ray fluoroscopy.  Potential radiation risks to you include (but are not limited to) the following: - A slightly elevated risk for cancer  several years later in life. This risk is typically less than 0.5% percent. This risk is low in comparison to the normal incidence of human cancer, which is 33% for women and 50% for men according to the [employer]. - Radiation induced injury can include skin redness, resembling a rash, tissue breakdown / ulcers and hair loss (which can be temporary or permanent).   The likelihood of either of these occurring depends on the difficulty of the procedure and whether you are sensitive to  radiation due to previous procedures, disease, or genetic conditions.   IF your procedure requires a prolonged use of radiation, you will be notified and given written instructions for further action.  It is your responsibility to monitor the irradiated area for the 2 weeks following the procedure and to notify your physician if you are concerned that you have suffered a radiation induced injury.    All  of the patient's questions were answered, patient is agreeable to proceed.  Consent signed and in chart.  Thank you for this interesting consult.  I greatly enjoyed meeting MARISOL GLAZER and look forward to participating in their care.  A copy of this report was sent to the requesting provider on this date.  Electronically Signed: Lavonia Drafts, PA-C 11/28/2018, 9:12 AM   I spent a total of 40 Minutes    in face to face in clinical consultation, greater than 50% of which was counseling/coordinating care for cerebral arteriogram

## 2018-11-29 ENCOUNTER — Observation Stay (HOSPITAL_COMMUNITY): Payer: PRIVATE HEALTH INSURANCE

## 2018-11-29 DIAGNOSIS — I67841 Reversible cerebrovascular vasoconstriction syndrome: Secondary | ICD-10-CM | POA: Diagnosis not present

## 2018-11-29 DIAGNOSIS — G44219 Episodic tension-type headache, not intractable: Secondary | ICD-10-CM

## 2018-11-29 DIAGNOSIS — I776 Arteritis, unspecified: Secondary | ICD-10-CM | POA: Diagnosis not present

## 2018-11-29 DIAGNOSIS — I679 Cerebrovascular disease, unspecified: Secondary | ICD-10-CM | POA: Diagnosis not present

## 2018-11-29 HISTORY — PX: IR ANGIO INTRA EXTRACRAN SEL COM CAROTID INNOMINATE BILAT MOD SED: IMG5360

## 2018-11-29 HISTORY — PX: IR ANGIO VERTEBRAL SEL VERTEBRAL BILAT MOD SED: IMG5369

## 2018-11-29 LAB — RHEUMATOID FACTOR: Rheumatoid fact SerPl-aCnc: <10 IU/mL (ref 0.0–13.9)

## 2018-11-29 LAB — ANTI-DNA ANTIBODY, DOUBLE-STRANDED: ds DNA Ab: <1 IU/mL (ref 0–9)

## 2018-11-29 LAB — MPO/PR-3 (ANCA) ANTIBODIES
ANCA Proteinase 3: <3.5 U/mL (ref 0.0–3.5)
Myeloperoxidase Abs: <9 U/mL (ref 0.0–9.0)

## 2018-11-29 LAB — RPR: RPR Ser Ql: NONREACTIVE

## 2018-11-29 LAB — SJOGRENS SYNDROME-A EXTRACTABLE NUCLEAR ANTIBODY: SSA (Ro) (ENA) Antibody, IgG: <0.2 AI (ref 0.0–0.9)

## 2018-11-29 LAB — SJOGRENS SYNDROME-B EXTRACTABLE NUCLEAR ANTIBODY: SSB (La) (ENA) Antibody, IgG: <0.2 AI (ref 0.0–0.9)

## 2018-11-29 LAB — ANA: Anti Nuclear Antibody (ANA): NEGATIVE

## 2018-11-29 MED ORDER — LIDOCAINE HCL 1 % IJ SOLN
INTRAMUSCULAR | Status: AC
  Filled 2018-11-29: qty 20

## 2018-11-29 MED ORDER — FENTANYL CITRATE (PF) 100 MCG/2ML IJ SOLN
INTRAMUSCULAR | Status: AC
  Filled 2018-11-29: qty 2

## 2018-11-29 MED ORDER — MIDAZOLAM HCL 2 MG/2ML IJ SOLN
INTRAMUSCULAR | Status: AC
  Filled 2018-11-29: qty 2

## 2018-11-29 MED ORDER — MIDAZOLAM HCL 2 MG/2ML IJ SOLN
INTRAMUSCULAR | Status: AC | PRN
  Administered 2018-11-29: 1 mg via INTRAVENOUS

## 2018-11-29 MED ORDER — ASPIRIN 81 MG PO TBEC: 81.0000 mg | DELAYED_RELEASE_TABLET | Freq: Every day | ORAL | Status: DC

## 2018-11-29 MED ORDER — IOHEXOL 300 MG/ML  SOLN
150.0000 mL | Freq: Once | INTRAMUSCULAR | Status: AC | PRN
  Administered 2018-11-29: 75 mL via INTRAVENOUS

## 2018-11-29 MED ORDER — HEPARIN SODIUM (PORCINE) 1000 UNIT/ML IJ SOLN
INTRAMUSCULAR | Status: AC | PRN
  Administered 2018-11-29: 1000 [IU] via INTRAVENOUS

## 2018-11-29 MED ORDER — SODIUM CHLORIDE 0.9 % IV SOLN
INTRAVENOUS | Status: AC
  Administered 2018-11-29: 13:00:00 via INTRAVENOUS

## 2018-11-29 MED ORDER — HEPARIN SODIUM (PORCINE) 1000 UNIT/ML IJ SOLN
INTRAMUSCULAR | Status: AC
  Filled 2018-11-29: qty 1

## 2018-11-29 MED ORDER — FENTANYL CITRATE (PF) 100 MCG/2ML IJ SOLN
INTRAMUSCULAR | Status: AC | PRN
  Administered 2018-11-29: 25 ug via INTRAVENOUS

## 2018-11-29 MED ORDER — LIDOCAINE HCL 1 % IJ SOLN
INTRAMUSCULAR | Status: AC | PRN
  Administered 2018-11-29: 15 mL

## 2018-11-29 NOTE — Discharge Summary (Signed)
Physician Discharge Summary  Chad Moyer ZOX:[id number] DOB: Oct 10, 1975 DOA: 11/27/2018  PCP: [name], [name], MD  Admit date: 11/27/2018 Discharge date: 11/29/2018  Admitted From: home  Disposition:  Home   Recommendations for Outpatient Follow-up:  1. Follow up with PCP in 1-2 weeks 2. Follow-up with neurology, you were referred to neurology.  Home Health: Not applicable Equipment/Devices: Not applicable  Discharge Condition: Stable CODE STATUS: Full code Diet recommendation: Regular diet  Brief/Interim Summary: Patient is a 43 year old gentleman with no significant medical problems came to the emergency room with 2 episodes of severe global headache that were transient.  This was very unusual for him.  In the emergency room CTA of the head was done that showed some narrowing of the intracranial vessels concerning for vasculitis.  Patient had no other positive findings.  He was admitted to the hospital and underwent various investigations, seen and followed by neurology.  Discharge Diagnoses:  Principal Problem:   Headache  Severe intractable headache: Resolved.  CTA head concerning for multifocal intracranial stenosis.  MRI of the brain unremarkable.  2D echocardiogram normal.  No evidence of embolus.  Trans-carotid Doppler no vasospasm.  LDL 88.  A1c 6.2. Patient underwent cerebral angiogram today that showed nonspecific narrowing of the intracranial arteries. Patient has various investigations sent including autoimmune labs, RPR that needs to be followed up. He was advised spinal tap that he declined in this admission.  He would like to wait for blood reports to come back. Patient is asymptomatic today. Patient would like to go home.  Referral made to neurology for outpatient follow-up as per neurology recommendation in the hospital. Patient was advised to continue on baby aspirin. Patient recently quit smoking, encouraged.   Discharge Instructions  Discharge  Instructions    Ambulatory referral to Neurology   Complete by: As directed    An appointment is requested in approximately: 2 weeks   Call MD for:   Complete by: As directed    Frequent headaches   Call MD for:  persistant dizziness or light-headedness   Complete by: As directed    Call MD for:  persistant nausea and vomiting   Complete by: As directed    Diet - low sodium heart healthy   Complete by: As directed    Increase activity slowly   Complete by: As directed      Allergies as of 11/29/2018   No Known Allergies     Medication List    STOP taking these medications   buPROPion 150 MG 12 hr tablet Commonly known as: Wellbutrin SR   ibuprofen 200 MG tablet Commonly known as: ADVIL     TAKE these medications   acetaminophen 500 MG tablet Commonly known as: TYLENOL Take 1,000 mg by mouth every 6 (six) hours as needed for headache.   aspirin 81 MG EC tablet Take 1 tablet (81 mg total) by mouth daily. Start taking on: November 30, 2018   multivitamin with minerals Tabs tablet Take 1 tablet by mouth daily.       No Known Allergies  Consultations:  Neurology  Neuroradiology   Procedures/Studies: Ct Angio Head W Or Wo Contrast  Result Date: 11/27/2018 CLINICAL DATA:  Intermittent headaches since 11/22/2018. EXAM: CT ANGIOGRAPHY HEAD TECHNIQUE: Multidetector CT imaging of the head was performed using the standard protocol during bolus administration of intravenous contrast. Multiplanar CT image reconstructions and MIPs were obtained to evaluate the vascular anatomy. CONTRAST:  75mL OMNIPAQUE IOHEXOL 350 MG/ML SOLN COMPARISON:  None.  FINDINGS: CT HEAD Brain: There is no evidence of acute infarct, intracranial hemorrhage, mass, midline shift, or extra-axial fluid collection. The ventricles and sulci are normal. Vascular: No hyperdense vessel. Skull: No fracture or focal osseous lesion. Sinuses: Visualized paranasal sinuses and mastoid air cells are clear. Orbits:  Unremarkable. CTA HEAD Anterior circulation: The internal carotid arteries are widely patent from skull base to carotid termini. ACAs and MCAs are patent without evidence of proximal branch occlusion, however there is the appearance of widespread branch vessel irregularity and narrowing including severe bilateral A1 and A2 stenoses. MCA branch vessel stenoses are slightly greater on the right than the left, and there is mild right M1 narrowing. No aneurysm is identified. Posterior circulation: The visualized distal vertebral arteries are widely patent to the basilar with the left being moderately dominant. Patent PICA and SCA origins are seen bilaterally. The basilar artery is widely patent. There is a small right posterior communicating artery. A left posterior communicating artery is not clearly identified and may be diminutive or absent. The PCAs are patent with mild P1 narrowing bilaterally as well as prominent attenuation and irregularity of both PCAs more distally. No aneurysm is identified. Venous sinuses: Patent. Anatomic variants: None. IMPRESSION: 1. 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 2. No large vessel occlusion or aneurysm. 3. Unremarkable noncontrast CT appearance of the brain. Electronically Signed   By: Sebastian AcheAllen  Grady M.D.   On: 11/27/2018 21:47   Mr Brain Wo Contrast  Result Date: 11/28/2018 CLINICAL DATA:  Acute headache EXAM: MRI HEAD WITHOUT CONTRAST TECHNIQUE: Multiplanar, multiecho pulse sequences of the brain and surrounding structures were obtained without intravenous contrast. COMPARISON:  Head CTA from yesterday FINDINGS: Brain: No acute or remote infarction, hemorrhage, hydrocephalus, extra-axial collection or mass lesion. No white matter disease or atrophy Vascular: See preceding CTA Skull and upper cervical spine: Negative for marrow lesion. Sinuses/Orbits:  Negative IMPRESSION: Negative brain MRI. Electronically Signed   By: Marnee SpringJonathon  Watts M.D.   On: 11/28/2018 06:47   [place] Transcranial Doppler  Result Date: 11/28/2018  Transcranial Doppler Indications: Stroke. Performing Technologist: Gertie FeyMichelle Simonetti MHA, RDMS, RVT, RDCS  Examination Guidelines: A complete evaluation includes B-mode imaging, spectral Doppler, color Doppler, and power Doppler as needed of all accessible portions of each vessel. Bilateral testing is considered an integral part of a complete examination. Limited examinations for reoccurring indications may be performed as noted.  +----------+-------------+----------+-----------+------------------+ RIGHT TCD Right VM (cm)Depth (cm)Pulsatility     Comment       +----------+-------------+----------+-----------+------------------+ MCA           72.00                 0.89                       +----------+-------------+----------+-----------+------------------+ ACA                                         Unable to insonate +----------+-------------+----------+-----------+------------------+ Term ICA      58.00                 0.94                       +----------+-------------+----------+-----------+------------------+ PCA  Unable to insonate +----------+-------------+----------+-----------+------------------+ Opthalmic     17.00                 1.18                       +----------+-------------+----------+-----------+------------------+ ICA siphon    43.00                 1.19                       +----------+-------------+----------+-----------+------------------+ Vertebral                                   Unable to insonate +----------+-------------+----------+-----------+------------------+  +----------+------------+----------+-----------+------------------+ LEFT TCD  Left VM (cm)Depth (cm)Pulsatility     Comment        +----------+------------+----------+-----------+------------------+ MCA          69.00                 1.04                       +----------+------------+----------+-----------+------------------+ ACA          -41.00                1.09                       +----------+------------+----------+-----------+------------------+ Term ICA     71.00                 0.96                       +----------+------------+----------+-----------+------------------+ PCA          15.00                 0.86                       +----------+------------+----------+-----------+------------------+ Opthalmic    17.00                 1.32                       +----------+------------+----------+-----------+------------------+ ICA siphon   19.00                 1.32                       +----------+------------+----------+-----------+------------------+ Vertebral                                  Unable to insonate +----------+------------+----------+-----------+------------------+  +------------+-------+------------------+             VM cm/s     Comment       +------------+-------+------------------+ Prox Basilar       Unable to insonate +------------+-------+------------------+    Preliminary        Subjective: Patient seen and examined after cerebral angiogram.  He had no symptoms.  He was eager to go home tonight.  He would keep up with appointment with neurology.  No headaches since admission.   Discharge Exam: Vitals:   11/29/18 1230 11/29/18 1450  BP: (!) 107/54 (!) 112/52  Pulse: (!) 55 (!) 51  Resp:    Temp:  SpO2: 96% 99%   Vitals:   11/29/18 1140 11/29/18 1202 11/29/18 1230 11/29/18 1450  BP: 134/83 131/73 (!) 107/54 (!) 112/52  Pulse: 62 (!) 53 (!) 55 (!) 51  Resp: 17     Temp:  99.1 F (37.3 C)    TempSrc:  Oral    SpO2: 97% 99% 96% 99%  Weight:      Height:        General: Pt is alert, awake, not in acute distress Cardiovascular: RRR,  S1/S2 +, no rubs, no gallops Respiratory: CTA bilaterally, no wheezing, no rhonchi Abdominal: Soft, NT, ND, bowel sounds + Extremities: no edema, no cyanosis    The results of significant diagnostics from this hospitalization (including imaging, microbiology, ancillary and laboratory) are listed below for reference.     Microbiology: Recent Results (from the past 240 hour(s))  SARS Coronavirus 2 (CEPHEID - Performed in [facility] hospital lab), Hosp Order     Status: None   Collection Time: 11/27/18 10:37 PM   Specimen: Nasopharyngeal Swab  Result Value Ref Range Status   SARS Coronavirus 2 NEGATIVE NEGATIVE Final    Comment: (NOTE) If result is NEGATIVE SARS-CoV-2 target nucleic acids are NOT DETECTED. The SARS-CoV-2 RNA is generally detectable in upper and lower  respiratory specimens during the acute phase of infection. The lowest  concentration of SARS-CoV-2 viral copies this assay can detect is 250  copies / mL. A negative result does not preclude SARS-CoV-2 infection  and should not be used as the sole basis for treatment or other  patient management decisions.  A negative result may occur with  improper specimen collection / handling, submission of specimen other  than nasopharyngeal swab, presence of viral mutation(s) within the  areas targeted by this assay, and inadequate number of viral copies  (<250 copies / mL). A negative result must be combined with clinical  observations, patient history, and epidemiological information. If result is POSITIVE SARS-CoV-2 target nucleic acids are DETECTED. The SARS-CoV-2 RNA is generally detectable in upper and lower  respiratory specimens dur ing the acute phase of infection.  Positive  results are indicative of active infection with SARS-CoV-2.  Clinical  correlation with patient history and other diagnostic information is  necessary to determine patient infection status.  Positive results do  not rule out bacterial infection  or co-infection with other viruses. If result is PRESUMPTIVE POSTIVE SARS-CoV-2 nucleic acids MAY BE PRESENT.   A presumptive positive result was obtained on the submitted specimen  and confirmed on repeat testing.  While 2019 novel coronavirus  (SARS-CoV-2) nucleic acids may be present in the submitted sample  additional confirmatory testing may be necessary for epidemiological  and / or clinical management purposes  to differentiate between  SARS-CoV-2 and other Sarbecovirus currently known to infect humans.  If clinically indicated additional testing with an alternate test  methodology [phone]) is advised. The SARS-CoV-2 RNA is generally  detectable in upper and lower respiratory sp ecimens during the acute  phase of infection. The expected result is Negative. Fact Sheet for Patients:  BoilerBrush.com.cy Fact Sheet for Healthcare Providers: https://pope.com/ This test is not yet approved or cleared by the [place] FDA and has been authorized for detection and/or diagnosis of SARS-CoV-2 by FDA under an Emergency Use Authorization (EUA).  This EUA will remain in effect (meaning this test can be used) for the duration of the COVID-19 declaration under Section 564(b)(1) of the Act, 21 U.S.C. section 360bbb-3(b)(1), unless the authorization is terminated  or revoked sooner. Performed at [facility], [place] [address], [place], [place] [address]      Labs: BNP (last 3 results) No results for input(s): BNP in the last 8760 hours. Basic Metabolic Panel: Recent Labs  Lab 11/27/18 2012 11/28/18 0339  NA 137 139  K 3.9 3.8  CL 105 105  CO2 23 24  GLUCOSE 102* 94  BUN 12 10  CREATININE 1.18 0.97  CALCIUM 9.1 9.0   Liver Function Tests: No results for input(s): AST, ALT, ALKPHOS, BILITOT, PROT, ALBUMIN in the last 168 hours. No results for input(s): LIPASE, AMYLASE in the last 168 hours. No results for input(s): AMMONIA  in the last 168 hours. CBC: Recent Labs  Lab 11/27/18 2012 11/28/18 0339  WBC 9.2 9.3  NEUTROABS 5.8  --   HGB 15.7 15.3  HCT 47.2 47.4  MCV 84.9 85.6  PLT 404* 417*   Cardiac Enzymes: No results for input(s): CKTOTAL, CKMB, CKMBINDEX, TROPONINI in the last 168 hours. BNP: Invalid input(s): POCBNP CBG: No results for input(s): GLUCAP in the last 168 hours. D-Dimer No results for input(s): DDIMER in the last 72 hours. Hgb A1c Recent Labs    11/28/18 0825  HGBA1C 6.2*   Lipid Profile Recent Labs    11/28/18 0825  CHOL 152  HDL 39*  LDLCALC 88  TRIG 123  CHOLHDL 3.9   Thyroid function studies No results for input(s): TSH, T4TOTAL, T3FREE, THYROIDAB in the last 72 hours.  Invalid input(s): FREET3 Anemia work up No results for input(s): VITAMINB12, FOLATE, FERRITIN, TIBC, IRON, RETICCTPCT in the last 72 hours. Urinalysis No results found for: COLORURINE, APPEARANCEUR, [place], [place], GLUCOSEU, [place], [place], KETONESUR, PROTEINUR, UROBILINOGEN, NITRITE, LEUKOCYTESUR Sepsis Labs Invalid input(s): PROCALCITONIN,  WBC,  LACTICIDVEN Microbiology Recent Results (from the past 240 hour(s))  SARS Coronavirus 2 (CEPHEID - Performed in [place] hospital lab), Hosp Order     Status: None   Collection Time: 11/27/18 10:37 PM   Specimen: Nasopharyngeal Swab  Result Value Ref Range Status   SARS Coronavirus 2 NEGATIVE NEGATIVE Final    Comment: (NOTE) If result is NEGATIVE SARS-CoV-2 target nucleic acids are NOT DETECTED. The SARS-CoV-2 RNA is generally detectable in upper and lower  respiratory specimens during the acute phase of infection. The lowest  concentration of SARS-CoV-2 viral copies this assay can detect is 250  copies / mL. A negative result does not preclude SARS-CoV-2 infection  and should not be used as the sole basis for treatment or other  patient management decisions.  A negative result may occur with  improper specimen collection / handling,  submission of specimen other  than nasopharyngeal swab, presence of viral mutation(s) within the  areas targeted by this assay, and inadequate number of viral copies  (<250 copies / mL). A negative result must be combined with clinical  observations, patient history, and epidemiological information. If result is POSITIVE SARS-CoV-2 target nucleic acids are DETECTED. The SARS-CoV-2 RNA is generally detectable in upper and lower  respiratory specimens dur ing the acute phase of infection.  Positive  results are indicative of active infection with SARS-CoV-2.  Clinical  correlation with patient history and other diagnostic information is  necessary to determine patient infection status.  Positive results do  not rule out bacterial infection or co-infection with other viruses. If result is PRESUMPTIVE POSTIVE SARS-CoV-2 nucleic acids MAY BE PRESENT.   A presumptive positive result was obtained on the submitted specimen  and confirmed on repeat testing.  While 2019 novel coronavirus  (SARS-CoV-2) nucleic acids may be present in the submitted sample  additional confirmatory testing may be necessary for epidemiological  and / or clinical management purposes  to differentiate between  SARS-CoV-2 and other Sarbecovirus currently known to infect humans.  If clinically indicated additional testing with an alternate test  methodology [phone]) is advised. The SARS-CoV-2 RNA is generally  detectable in upper and lower respiratory sp ecimens during the acute  phase of infection. The expected result is Negative. Fact Sheet for Patients:  BoilerBrush.com.cy Fact Sheet for Healthcare Providers: https://pope.com/ This test is not yet approved or cleared by the [place] FDA and has been authorized for detection and/or diagnosis of SARS-CoV-2 by FDA under an Emergency Use Authorization (EUA).  This EUA will remain in effect (meaning this test can be  used) for the duration of the COVID-19 declaration under Section 564(b)(1) of the Act, 21 U.S.C. section 360bbb-3(b)(1), unless the authorization is terminated or revoked sooner. Performed at [facility], [address] [place], [place] [address]      Time coordinating discharge: 25 minutes  SIGNED:   Dorcas Carrow, MD  [facility] 11/29/2018, 4:11 PM

## 2018-11-29 NOTE — Sedation Documentation (Signed)
SWF09 monitor/ o2 removed per MD

## 2018-11-29 NOTE — Progress Notes (Signed)
Discharge instructions given. Pt verbalized understanding and all questions were answered.  

## 2018-11-29 NOTE — Procedures (Signed)
S/P 4 vessel cerebral arteriogram RT CFA approach. Findings. 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

## 2018-11-29 NOTE — Progress Notes (Addendum)
STROKE TEAM PROGRESS NOTE   INTERVAL HISTORY Pt lying in bed with leg straight s/p angiogram. Discussed with Dr. Corliss Skainseveshwar and pt showed non-specific b/l ACA multifocal narrowings. Pt declined LP in hospital but willing to do so as outpt if needed on follow up.   Vitals:   11/29/18 1135 11/29/18 1140 11/29/18 1202 11/29/18 1230  BP: 134/75 134/83 131/73 (!) 107/54  Pulse: (!) 55 62 (!) 53 (!) 55  Resp: 19 17    Temp:   99.1 F (37.3 C)   TempSrc:   Oral   SpO2: 97% 97% 99% 96%  Weight:      Height:        CBC:  Recent Labs  Lab 11/27/18 2012 11/28/18 0339  WBC 9.2 9.3  NEUTROABS 5.8  --   HGB 15.7 15.3  HCT 47.2 47.4  MCV 84.9 85.6  PLT 404* 417*    Basic Metabolic Panel:  Recent Labs  Lab 11/27/18 2012 11/28/18 0339  NA 137 139  K 3.9 3.8  CL 105 105  CO2 23 24  GLUCOSE 102* 94  BUN 12 10  CREATININE 1.18 0.97  CALCIUM 9.1 9.0   Lipid Panel:     Component Value Date/Time   CHOL 152 11/28/2018 0825   TRIG 123 11/28/2018 0825   HDL 39 (L) 11/28/2018 0825   CHOLHDL 3.9 11/28/2018 0825   VLDL 25 11/28/2018 0825   LDLCALC 88 11/28/2018 0825   LDLCALC 108 (H) 02/21/2018 1004   HgbA1c:  Lab Results  Component Value Date   HGBA1C 6.2 (H) 11/28/2018   Urine Drug Screen:     Component Value Date/Time   LABOPIA NONE DETECTED 11/27/2018 2214   COCAINSCRNUR NONE DETECTED 11/27/2018 2214   LABBENZ NONE DETECTED 11/27/2018 2214   AMPHETMU NONE DETECTED 11/27/2018 2214   THCU NONE DETECTED 11/27/2018 2214   LABBARB NONE DETECTED 11/27/2018 2214    Alcohol Level No results found for: ETH  IMAGING Ct Angio Head W Or Wo Contrast  Result Date: 11/27/2018 CLINICAL DATA:  Intermittent headaches since 11/22/2018. EXAM: CT ANGIOGRAPHY HEAD TECHNIQUE: Multidetector CT imaging of the head was performed using the standard protocol during bolus administration of intravenous contrast. Multiplanar CT image reconstructions and MIPs were obtained to evaluate the vascular  anatomy. CONTRAST:  75mL OMNIPAQUE IOHEXOL 350 MG/ML SOLN COMPARISON:  None. FINDINGS: CT HEAD Brain: There is no evidence of acute infarct, intracranial hemorrhage, mass, midline shift, or extra-axial fluid collection. The ventricles and sulci are normal. Vascular: No hyperdense vessel. Skull: No fracture or focal osseous lesion. Sinuses: Visualized paranasal sinuses and mastoid air cells are clear. Orbits: Unremarkable. CTA HEAD Anterior circulation: The internal carotid arteries are widely patent from skull base to carotid termini. ACAs and MCAs are patent without evidence of proximal branch occlusion, however there is the appearance of widespread branch vessel irregularity and narrowing including severe bilateral A1 and A2 stenoses. MCA branch vessel stenoses are slightly greater on the right than the left, and there is mild right M1 narrowing. No aneurysm is identified. Posterior circulation: The visualized distal vertebral arteries are widely patent to the basilar with the left being moderately dominant. Patent PICA and SCA origins are seen bilaterally. The basilar artery is widely patent. There is a small right posterior communicating artery. A left posterior communicating artery is not clearly identified and may be diminutive or absent. The PCAs are patent with mild P1 narrowing bilaterally as well as prominent attenuation and irregularity of both PCAs more distally.  No aneurysm is identified. Venous sinuses: Patent. Anatomic variants: None. IMPRESSION: 1. 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 2. No large vessel occlusion or aneurysm. 3. Unremarkable noncontrast CT appearance of the brain. Electronically Signed   By: Logan Bores M.D.   On: 11/27/2018 21:47   Mr Brain Wo Contrast  Result Date: 11/28/2018 CLINICAL DATA:  Acute headache EXAM: MRI HEAD WITHOUT CONTRAST TECHNIQUE:  Multiplanar, multiecho pulse sequences of the brain and surrounding structures were obtained without intravenous contrast. COMPARISON:  Head CTA from yesterday FINDINGS: Brain: No acute or remote infarction, hemorrhage, hydrocephalus, extra-axial collection or mass lesion. No white matter disease or atrophy Vascular: See preceding CTA Skull and upper cervical spine: Negative for marrow lesion. Sinuses/Orbits: Negative IMPRESSION: Negative brain MRI. Electronically Signed   By: Monte Fantasia M.D.   On: 11/28/2018 06:47   Vas [place] Transcranial Doppler  Result Date: 11/28/2018  Transcranial Doppler Indications: Stroke. Performing Technologist: Maudry Mayhew MHA, RDMS, RVT, RDCS  Examination Guidelines: A complete evaluation includes B-mode imaging, spectral Doppler, color Doppler, and power Doppler as needed of all accessible portions of each vessel. Bilateral testing is considered an integral part of a complete examination. Limited examinations for reoccurring indications may be performed as noted.  +----------+-------------+----------+-----------+------------------+ RIGHT TCD Right VM (cm)Depth (cm)Pulsatility     Comment       +----------+-------------+----------+-----------+------------------+ MCA           72.00                 0.89                       +----------+-------------+----------+-----------+------------------+ ACA                                         Unable to insonate +----------+-------------+----------+-----------+------------------+ Term ICA      58.00                 0.94                       +----------+-------------+----------+-----------+------------------+ PCA                                         Unable to insonate +----------+-------------+----------+-----------+------------------+ Opthalmic     17.00                 1.18                       +----------+-------------+----------+-----------+------------------+ ICA siphon    43.00                  1.19                       +----------+-------------+----------+-----------+------------------+ Vertebral                                   Unable to insonate +----------+-------------+----------+-----------+------------------+  +----------+------------+----------+-----------+------------------+ LEFT TCD  Left VM (cm)Depth (cm)Pulsatility     Comment       +----------+------------+----------+-----------+------------------+ MCA  69.00                 1.04                       +----------+------------+----------+-----------+------------------+ ACA          -41.00                1.09                       +----------+------------+----------+-----------+------------------+ Term ICA     71.00                 0.96                       +----------+------------+----------+-----------+------------------+ PCA          15.00                 0.86                       +----------+------------+----------+-----------+------------------+ Opthalmic    17.00                 1.32                       +----------+------------+----------+-----------+------------------+ ICA siphon   19.00                 1.32                       +----------+------------+----------+-----------+------------------+ Vertebral                                  Unable to insonate +----------+------------+----------+-----------+------------------+  +------------+-------+------------------+             VM cm/s     Comment       +------------+-------+------------------+ Prox Basilar       Unable to insonate +------------+-------+------------------+    Preliminary     PHYSICAL EXAM  Temp:  [98.1 F (36.7 C)-99.1 F (37.3 C)] 99.1 F (37.3 C) (07/16 1202) Pulse Rate:  [51-80] 55 (07/16 1230) Resp:  [10-19] 17 (07/16 1140) BP: (107-146)/(6-93) 107/54 (07/16 1230) SpO2:  [94 %-100 %] 96 % (07/16 1230)  General - Well nourished, well developed, in no apparent  distress.  Ophthalmologic - fundi not visualized due to noncooperation.  Cardiovascular - Regular rate and rhythm.  Mental Status -  Level of arousal and orientation to time, place, and person were intact. Language including expression, naming, repetition, comprehension was assessed and found intact. Attention span and concentration were normal. Recent and remote memory were intact. Fund of Knowledge was assessed and was intact.  Cranial Nerves II - XII - II - Visual field intact OU. III, IV, VI - Extraocular movements intact. V - Facial sensation intact bilaterally. VII - Facial movement intact bilaterally. VIII - Hearing & vestibular intact bilaterally. X - Palate elevates symmetrically. XI - Chin turning & shoulder shrug intact bilaterally. XII - Tongue protrusion intact.  Motor Strength - The patient's strength was normal in all extremities and pronator drift was absent except left leg straight s/p angiogram.  Bulk was normal and fasciculations were absent.   Motor Tone - Muscle tone was assessed at the neck and appendages and was normal.  Reflexes - The patient's reflexes were symmetrical  in all extremities and he had no pathological reflexes.  Sensory - Light touch, temperature/pinprick were assessed and were symmetrical.    Coordination - The patient had normal movements in the hands and feet with no ataxia or dysmetria.  Tremor was absent.  Gait and Station - deferred.   ASSESSMENT/PLAN Mr. Otho KetGeoffrey L Dockendorf is a 43 y.o. male with no significant past medical history presenting with 3rd severe headache in 1 week.   HA - RCVS vs. Intracranial atherosclerosis vs CNS vasculitis. Pt dose have stroke risk factor with smoking and obesity. However, need to repeat CTA in one month to rule out RCVS and to consider LP to rule out CNS vasculitis but less likely  CTA head widespread intracranial arterial irregularity and numerous severe stenosis including b/l ACA and right M1  concerning for vasculitis.  No LVO.  MRI  Unremarkable   Cerebral angiogram Multiple non-specific areas of  smooth caliber narrowing of ACAS in the pericallosal regions, RT MCA dominant inferior division proximally and LT ACA  A 1 seg.  2D Echo EF 55-60%. No source of embolus   TCD no vasospasm   Pt declined LP but willing to consider as outpt on follow up  LDL 88  HgbA1c 6.2  Autoimmune lab thus far neg or pending  HIV and RPR neg  SCDs for VTE prophylaxis  No antithrombotic prior to admission, now on ASA 81mg . Continue on discharge  Dr. Otelia LimesLindzen recommended nimodipine 30mg  Q4h for 48 hours  Disposition:  Return home - he will follow up with [facility] to consider repeat CTA head to evaluate for RCVS and to consider LP to rule out vasculitis if still needed  Tobacco abuse  Current smoker  Smoking cessation counseling provided  quit 1 mo ago after taking Wellbutrin. Has not taken Wellbutrin for the past month  Pt is willing to quit  Other Stroke Risk Factors  ETOH use, advised to drink no more than 2 drink(s) a day  Obesity, Body mass index is 37.48 kg/m., recommend weight loss, diet and exercise as appropriate   Family hx stroke (paternal aunt)  Hospital day # 0  Neurology will sign off. Please call with questions. Pt will follow [facility] with stroke clinic [facility] in about 4 weeks. Thanks for the consult.   Marvel PlanJindong Takumi Din, MD PhD Stroke Neurology 11/29/2018 1:31 PM    To contact Stroke Continuity provider, please refer to [email or web address]. After hours, contact General Neurology

## 2018-11-30 ENCOUNTER — Other Ambulatory Visit: Payer: Self-pay

## 2018-11-30 LAB — ANCA TITERS
Atypical P-ANCA titer: <1:20 {titer}
C-ANCA: <1:20 {titer}
P-ANCA: <1:20 {titer}

## 2018-12-03 ENCOUNTER — Ambulatory Visit (INDEPENDENT_AMBULATORY_CARE_PROVIDER_SITE_OTHER): Payer: No Typology Code available for payment source | Admitting: Family Medicine

## 2018-12-03 ENCOUNTER — Other Ambulatory Visit: Payer: Self-pay

## 2018-12-03 ENCOUNTER — Encounter (HOSPITAL_COMMUNITY): Payer: Self-pay | Admitting: Interventional Radiology

## 2018-12-03 VITALS — BP 128/74 | HR 80 | Temp 98.3°F | Resp 16 | Ht 69.0 in | Wt 266.0 lb

## 2018-12-03 DIAGNOSIS — Z720 Tobacco use: Secondary | ICD-10-CM | POA: Diagnosis not present

## 2018-12-03 DIAGNOSIS — G44219 Episodic tension-type headache, not intractable: Secondary | ICD-10-CM

## 2018-12-03 DIAGNOSIS — R7303 Prediabetes: Secondary | ICD-10-CM

## 2018-12-03 NOTE — Patient Instructions (Addendum)
Work on dietary changes , cut out carbs  F/U 3 months

## 2018-12-03 NOTE — Assessment & Plan Note (Signed)
Recheck A1c in 3 months.  Work on dietary changes and exercise.  Weight loss would also be a significant help as he is obese.

## 2018-12-03 NOTE — Assessment & Plan Note (Signed)
Currently smokes 4 to 5 cigarettes a day we discussed using Nicorette gum or low-dose nicotine patch or quitting on his own

## 2018-12-03 NOTE — Assessment & Plan Note (Signed)
Headaches have resolved.  Continue with aspirin.  I will keep him off of the Wellbutrin as this does have a high incidence of headaches as well.  He will follow-up with neurology next month.

## 2018-12-03 NOTE — Progress Notes (Signed)
Subjective:    Patient ID: Chad Moyer, male    DOB: [birth year], 43 y.o.   MRN: [id number]  Patient presents for Hospital F/U (intermittent HA- R side of head- vascular irregularity ) Patient here for hospital follow-up.  He was admitted to the hospital after having severe headache that was concerning for vasculitis.  Below from hospital discharge  Brief/Interim Summary: Patient is a 43 year old gentleman with no significant medical problems came to the emergency room with 2 episodes of severe global headache that were transient.  This was very unusual for him.  In the emergency room CTA of the head was done that showed some narrowing of the intracranial vessels concerning for vasculitis.  Patient had no other positive findings.  He was admitted to the hospital and underwent various investigations, seen and followed by neurology.  Discharge Diagnoses:  Severe intractable headache: Resolved.  CTA head concerning for multifocal intracranial stenosis.  MRI of the brain unremarkable.  2D echocardiogram normal.  No evidence of embolus.  Trans-carotid Doppler no vasospasm.  LDL 88.  A1c 6.2. Patient underwent cerebral angiogram today that showed nonspecific narrowing of the intracranial arteries. Patient has various investigations sent including autoimmune labs, RPR that needs to be followed up. He was advised spinal tap that he declined in this admission.  He would like to wait for blood reports to come back. Patient is asymptomatic today. Patient would like to go home.  Referral made to neurology for outpatient follow-up as per neurology recommendation in the hospital. Patient was advised to continue on baby aspirin. Patient recently quit smoking, encouraged.  On review of his labs from the hospital he does have history of glucose intolerance his A1c was 6.2%.  He also has history of hypertriglyceridemia cholesterol panel was normal.   August  24th wit Dr. Pearlean BrownieSethi / Spinal tap   Taking MVI daily   Recently brought exercise bike to start exercise program. Note he was taken off of Wellbutrin during his hospitalization. Note he has FMLA that needs to be completed he has been out of work since the 14th he had a procedure done today which he had to go through his groin was recommended that he not return to work until next Thursday  Review Of Systems:  GEN- denies fatigue, fever, weight loss,weakness, recent illness HEENT- denies eye drainage, change in vision, nasal discharge, CVS- denies chest pain, palpitations RESP- denies SOB, cough, wheeze ABD- denies N/V, change in stools, abd pain GU- denies dysuria, hematuria, dribbling, incontinence MSK- denies joint pain, muscle aches, injury Neuro- denies headache, dizziness, syncope, seizure activity       Objective:    BP 128/74   Pulse 80   Temp 98.3 F (36.8 C) (Oral)   Resp 16   Ht 5\' 9"  (1.753 m)   Wt 266 lb (120.7 kg)   SpO2 98%   BMI 39.28 kg/m  GEN- NAD, alert and oriented x3 HEENT- PERRL, EOMI, non injected sclera, pink conjunctiva, MMM, oropharynx clear Neck- Supple, no thyromegaly CVS- RRR, no murmur RESP-CTAB NEURO-CNII-XII grossly in tact, no focal deficits  EXT- No edema Pulses- Radial 2+        Assessment & Plan:      Problem List Items Addressed This Visit      Unprioritized   Borderline diabetes    Recheck A1c in 3 months.  Work on dietary changes and exercise.  Weight loss would also be a significant help as he is obese.  Headache    Headaches have resolved.  Continue with aspirin.  I will keep him off of the Wellbutrin as this does have a high incidence of headaches as well.  He will follow-up with neurology next month.      Tobacco use - Primary    Currently smokes 4 to 5 cigarettes a day we discussed using Nicorette gum or low-dose nicotine patch or quitting on his own         Note: This dictation was prepared with Dragon dictation along with smaller phrase  technology. Any transcriptional errors that result from this process are unintentional.

## 2018-12-04 ENCOUNTER — Inpatient Hospital Stay: Payer: No Typology Code available for payment source | Admitting: Family Medicine

## 2019-01-07 ENCOUNTER — Encounter: Payer: Self-pay | Admitting: Neurology

## 2019-01-07 ENCOUNTER — Ambulatory Visit (INDEPENDENT_AMBULATORY_CARE_PROVIDER_SITE_OTHER): Payer: No Typology Code available for payment source | Admitting: Neurology

## 2019-01-07 ENCOUNTER — Telehealth: Payer: Self-pay | Admitting: Neurology

## 2019-01-07 ENCOUNTER — Other Ambulatory Visit: Payer: Self-pay

## 2019-01-07 VITALS — BP 159/89 | HR 64 | Temp 97.8°F | Ht 70.0 in | Wt 277.0 lb

## 2019-01-07 DIAGNOSIS — I67841 Reversible cerebrovascular vasoconstriction syndrome: Secondary | ICD-10-CM

## 2019-01-07 DIAGNOSIS — G4482 Headache associated with sexual activity: Secondary | ICD-10-CM | POA: Diagnosis not present

## 2019-01-07 DIAGNOSIS — I776 Arteritis, unspecified: Secondary | ICD-10-CM

## 2019-01-07 NOTE — Progress Notes (Signed)
[facility] [address] [place] [id number] [phone]       OFFICE CONSULT NOTE  [name] Date of Birth:  [birth year] Medical Record Number:  [id number]   Referring MD: Marvel PlanJindong Xu Reason for Referral: Headache  HPI: Chad Moyer is a 43 year old African-American male with no significant past medical history who is seen today for evaluation for headache.  History is obtained from the patient, review of electronic medical records and I personally reviewed imaging films in PACS.  [facility] 11/27/2018 after having a third episode of severe headache within a week.  The first headache occurred during colitis and 11/22/2018 and lasted about 20 minutes.  Describes this as being mostly in the back of the head.  The second 1 occurred when he was walking briskly to a grocery store and when he turned his neck suddenly he describes severe pain in the back of his head as well as neck which lasted a few minutes only and it was severe enough for him to stop what he was doing and gradually got better.  Third episode also occurred when he was having sex.  The patient was treated with IV magnesium sulfate and headache resolved as soon as he got that infusion prior to being evaluated by the neuro hospitalist Dr. Otelia LimesLindzen.  CT scan of the head on admission was unremarkable subsequently an MRI scan was obtained which was normal as well.  CT angiogram on 11/27/2018 showed widespread intracranial irregularities in multiple vessels raising concern for vasculitis versus reversible vasoconstriction syndrome.  Subsequently underwent cerebral catheter angiogram on 12/03/2018 which showed some improvement but focal areas of moderate caliber irregularity involving pericallosal arteries bilaterally, left anterior cerebral artery A1 segment and right middle cerebral artery inferior division proximally.  These findings were felt to be related to reversible  vasoconstriction syndrome.  Spinal tap was discussed to rule out vasculitis or inflammatory conditions but patient refused.  Patient states is done well since discharge has had no further episodes of similar headaches.  He does however have vascular risk factors he does smoke about half pack per day and is obese and has LDL cholesterol of 88 mg percent and hemoglobin A1c of 6.2.  He denies episodes in the past suggestive of strokes TIAs.  The patient has not tried taking prophylactic Tylenol or ibuprofen prior to sex or other physical activities to see if it can avoid his headaches  ROS:   14 system review of systems is positive for headaches, neck pain only and all other systems negative  PMH: History reviewed. No pertinent past medical history.  Social History:  Social History   Socioeconomic History  . Marital status: Married    Spouse name: Not on file  . Number of children: Not on file  . Years of education: Not on file  . Highest education level: Not on file  Occupational History  . Not on file  Social Needs  . Financial resource strain: Not on file  . Food insecurity    Worry: Not on file    Inability: Not on file  . Transportation needs    Medical: Not on file    Non-medical: Not on file  Tobacco Use  . Smoking status: Current Every Day Smoker    Packs/day: 0.50    Years: 12.00    Pack years: 6.00    Types: Cigarettes  . Smokeless tobacco: Never Used  . Tobacco comment: 7 cigarettes per  day  Substance and Sexual Activity  . Alcohol use: Yes    Comment: occasionally  . Drug use: No  . Sexual activity: Yes    Birth control/protection: None  Lifestyle  . Physical activity    Days per week: Not on file    Minutes per session: Not on file  . Stress: Not on file  Relationships  . Social Musicianconnections    Talks on phone: Not on file    Gets together: Not on file    Attends religious service: Not on file    Active member of club or organization: Not on file    Attends  meetings of clubs or organizations: Not on file    Relationship status: Not on file  . Intimate partner violence    Fear of current or ex partner: Not on file    Emotionally abused: Not on file    Physically abused: Not on file    Forced sexual activity: Not on file  Other Topics Concern  . Not on file  Social History Narrative  . Not on file    Medications:   No current outpatient medications on file prior to visit.   No current facility-administered medications on file prior to visit.     Allergies:  No Known Allergies  Physical Exam General: well developed, well nourished mildly obese middle-aged African-American male, seated, in no evident distress Head: head normocephalic and atraumatic.   Neck: supple with no carotid or supraclavicular bruits Cardiovascular: regular rate and rhythm, no murmurs Musculoskeletal: no deformity Skin:  no rash/petichiae Vascular:  Normal pulses all extremities  Neurologic Exam Mental Status: Awake and fully alert. Oriented to place and time. Recent and remote memory intact. Attention span, concentration and fund of knowledge appropriate. Mood and affect appropriate.  Cranial Nerves: Fundoscopic exam reveals sharp disc margins. Pupils equal, briskly reactive to light. Extraocular movements full without nystagmus. Visual fields full to confrontation. Hearing intact. Facial sensation intact. Face, tongue, palate moves normally and symmetrically.  Motor: Normal bulk and tone. Normal strength in all tested extremity muscles. Sensory.: intact to touch , pinprick , position and vibratory sensation.  Coordination: Rapid alternating movements normal in all extremities. Finger-to-nose and heel-to-shin performed accurately bilaterally. Gait and Station: Arises from chair without difficulty. Stance is normal. Gait demonstrates normal stride length and balance . Able to heel, toe and tandem walk without difficulty.  Reflexes: 1+ and symmetric. Toes downgoing.        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  I doubt this is CNS vasculitis due to absence of any other associated neurological symptoms of systemic symptoms.  I recommend he take Tylenol or Motrin as prophylaxis prior to planned strenuous activity.  Recommend check follow-up CT angiogram to look for reversible narrowing of the intracranial vessels.  He also has vascular risk factors of obesity, borderline hyperlipidemia hypertension and suspected sleep apnea.  I recommend he start taking aspirin 81 mg daily as well as eat a healthy diet with lots of fruits, vegetables, cereals and whole grains and exercise regularly and lose weight.  Greater than 50% time during this 45-minute consultation visit was spent on counseling  and coordination of care about his headaches and discussion about abnormal brain imaging findings and answering questions he will return for follow-up in the future in 3 months with my nurse practitioner Janett Billow or call earlier if necessary. Antony Contras, MD  [facility] [address] [place], [place] [address]  Phone [phone] Fax [phone] Note: This document was prepared with digital dictation and possible smart phrase technology. Any transcriptional errors that result from this process are unintentional.

## 2019-01-07 NOTE — Telephone Encounter (Signed)
[employer] order sent to GI. They will obtain the auth and reach out to the patient to schedule.  

## 2019-01-07 NOTE — Patient Instructions (Signed)
I had a long discussion with the patient regarding his recurrent episodes of benign coital headaches and abnormal brain vascular imaging and answered questions.  I doubt this is CNS vasculitis due to absence of any other associated neurological symptoms of systemic symptoms.  I recommend he take Tylenol or Motrin as prophylaxis prior to planned strenuous activity.  Recommend check follow-up CT angiogram to look for reversible narrowing of the intracranial vessels.  He also has vascular risk factors of obesity, borderline hyperlipidemia hypertension and suspected sleep apnea.  I recommend he start taking aspirin 81 mg daily as well as eat a healthy diet with lots of fruits, vegetables, cereals and whole grains and exercise regularly and lose weight.  He will return for follow-up in the future in 3 months with my nurse practitioner Janett Billow or call earlier if necessary.

## 2019-03-06 ENCOUNTER — Encounter: Payer: Self-pay | Admitting: Family Medicine

## 2019-03-06 ENCOUNTER — Ambulatory Visit (INDEPENDENT_AMBULATORY_CARE_PROVIDER_SITE_OTHER): Payer: No Typology Code available for payment source | Admitting: Family Medicine

## 2019-03-06 ENCOUNTER — Other Ambulatory Visit: Payer: Self-pay

## 2019-03-06 VITALS — BP 144/82 | HR 90 | Temp 98.4°F | Resp 14 | Ht 70.0 in | Wt 271.0 lb

## 2019-03-06 DIAGNOSIS — E6609 Other obesity due to excess calories: Secondary | ICD-10-CM

## 2019-03-06 DIAGNOSIS — Z720 Tobacco use: Secondary | ICD-10-CM

## 2019-03-06 DIAGNOSIS — E781 Pure hyperglyceridemia: Secondary | ICD-10-CM | POA: Diagnosis not present

## 2019-03-06 DIAGNOSIS — R7303 Prediabetes: Secondary | ICD-10-CM | POA: Diagnosis not present

## 2019-03-06 DIAGNOSIS — Z6839 Body mass index (BMI) 39.0-39.9, adult: Secondary | ICD-10-CM

## 2019-03-06 MED ORDER — CHANTIX STARTING MONTH PAK 0.5 MG X 11 & 1 MG X 42 PO TABS: ORAL_TABLET | ORAL | 0 refills | Status: DC

## 2019-03-06 NOTE — Assessment & Plan Note (Signed)
Goal is a normal A1c.  Discussed reducing carbs and sweets in diet.  He would benefit from some weight loss.

## 2019-03-06 NOTE — Progress Notes (Signed)
   Subjective:    Patient ID: Chad Moyer, male    DOB: 02/07/1976, 43 y.o.   MRN: [id number]  Patient presents for Follow-up (is not fasting)   Pt here to f/u chronic medical problems  He was seen by neurology for his post coital headache  taking 81mg  once a day, has not had any further headaches    Borderline DM - last A1C 6.2% his weight is up 5 pounds or so since his last visit in July.  He has been trying to work on dietary changes.    Smokng 5-6/ a day , wants to restart Chantix     Review Of Systems:  GEN- denies fatigue, fever, weight loss,weakness, recent illness HEENT- denies eye drainage, change in vision, nasal discharge, CVS- denies chest pain, palpitations RESP- denies SOB, cough, wheeze ABD- denies N/V, change in stools, abd pain GU- denies dysuria, hematuria, dribbling, incontinence MSK- denies joint pain, muscle aches, injury Neuro- denies headache, dizziness, syncope, seizure activity       Objective:    BP (!) 144/82 (BP Location: Right Arm, Patient Position: Sitting, Cuff Size: Large)   Pulse 90   Temp 98.4 F (36.9 C) (Oral)   Resp 14   Ht 5\' 10"  (1.778 m)   Wt 271 lb (122.9 kg)   SpO2 95%   BMI 38.88 kg/m  GEN- NAD, alert and oriented x3 HEENT- PERRL, EOMI, non injected sclera, pink conjunctiva, MMM, oropharynx clear Neck- Supple, no thyromegaly CVS- RRR, no murmur RESP-CTAB ABD-NABS,soft,NT,ND EXT- No edema Pulses- Radial, DP- 2+        Assessment & Plan:      Problem List Items Addressed This Visit      Unprioritized   Borderline diabetes - Primary    Goal is a normal A1c.  Discussed reducing carbs and sweets in diet.  He would benefit from some weight loss.      Relevant Orders   Comprehensive metabolic panel   Hemoglobin A1c   Class 2 obesity with body mass index (BMI) of 39.0 to 39.9 in adult   Hypertriglyceridemia    Recent lipid panel done triglycerides were at goal      Relevant Medications   aspirin EC 81  MG tablet   Tobacco use    He would like to restart Chantix now that he has been cleared by neurology         Note: This dictation was prepared with Dragon dictation along with smaller phrase technology. Any transcriptional errors that result from this process are unintentional.

## 2019-03-06 NOTE — Assessment & Plan Note (Signed)
He would like to restart Chantix now that he has been cleared by neurology

## 2019-03-06 NOTE — Patient Instructions (Signed)
Start chantix  Work on diet  We will call with lab results  Continue ASA 81mg   F/U 6 months

## 2019-03-06 NOTE — Assessment & Plan Note (Signed)
Recent lipid panel done triglycerides were at goal

## 2019-03-07 LAB — COMPREHENSIVE METABOLIC PANEL
AG Ratio: 1.6 (calc) (ref 1.0–2.5)
ALT: 32 U/L (ref 9–46)
AST: 35 U/L (ref 10–40)
Albumin: 4.4 g/dL (ref 3.6–5.1)
Alkaline phosphatase (APISO): 58 U/L (ref 36–130)
BUN: 15 mg/dL (ref 7–25)
CO2: 27 mmol/L (ref 20–32)
Calcium: 9.6 mg/dL (ref 8.6–10.3)
Chloride: 106 mmol/L (ref 98–110)
Creat: 1.2 mg/dL (ref 0.60–1.35)
Globulin: 2.7 g/dL (calc) (ref 1.9–3.7)
Glucose, Bld: 102 mg/dL — ABNORMAL HIGH (ref 65–99)
Potassium: 4.3 mmol/L (ref 3.5–5.3)
Sodium: 140 mmol/L (ref 135–146)
Total Bilirubin: 0.3 mg/dL (ref 0.2–1.2)
Total Protein: 7.1 g/dL (ref 6.1–8.1)

## 2019-03-07 LAB — HEMOGLOBIN A1C
Hgb A1c MFr Bld: 5.8 % of total Hgb — ABNORMAL HIGH (ref <5.7)
Mean Plasma Glucose: 120 (calc)
eAG (mmol/L): 6.6 (calc)

## 2019-04-16 ENCOUNTER — Telehealth: Payer: Self-pay | Admitting: Adult Health

## 2019-04-16 NOTE — Telephone Encounter (Signed)
I called patient regarding confirming 12/3 appt with Janett Billow NP. Patient states he will not be able to make it and will call back to reschedule.

## 2019-04-18 ENCOUNTER — Ambulatory Visit: Payer: Self-pay | Admitting: Adult Health

## 2019-10-29 IMAGING — XA BILATERAL COMMON CAROTID AND INNOMINATE ANGIOGRAPHY
1 series · 12 of 24 positions shown · IV contrast (IODINE)
Comparison: CT angiogram of the head and neck of 11/27/2018, and
MRI of the brain of 11/28/2018.

CLINICAL DATA: Symptoms of headaches and transient right-sided
numbness and tingling. Abnormal CT angiogram of the brain.

EXAM:
BILATERAL COMMON CAROTID AND INNOMINATE ANGIOGRAPHY
TECHNIQUE: Informed written consent was obtained from the patient after a
thorough discussion of the procedural risks, benefits and
alternatives. All questions were addressed. Maximal Sterile Barrier
Technique was utilized including caps, mask, sterile gowns, sterile
gloves, sterile drape, hand hygiene and skin antiseptic. A timeout
was performed prior to the initiation of the procedure.

[Series 300: dr. (person_name) · 12 of 206 slices shown]
[im 9/206]
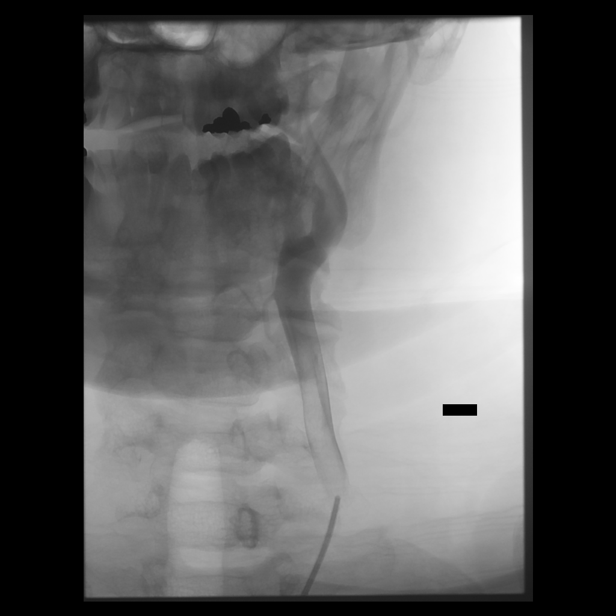
[im 27/206]
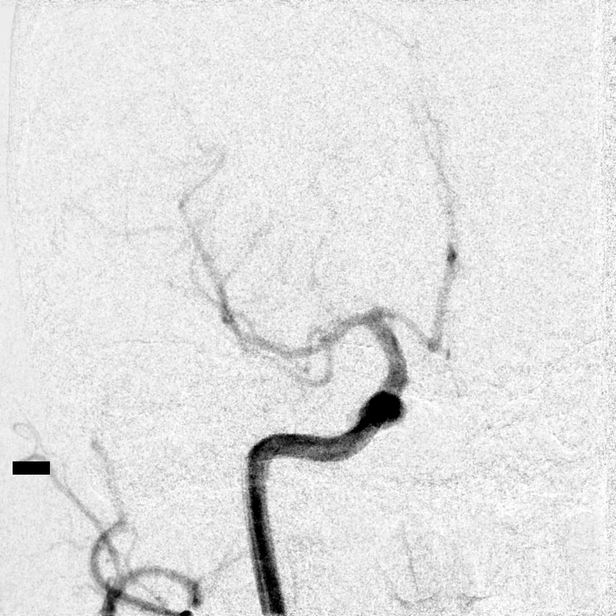
[im 45/206]
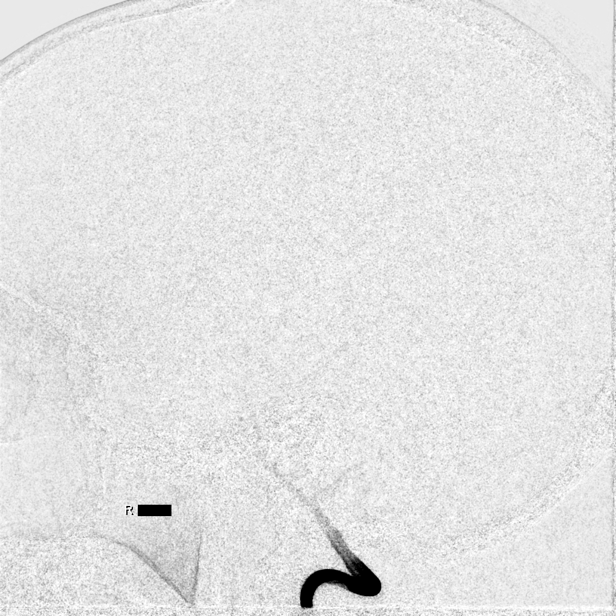
[im 63/206]
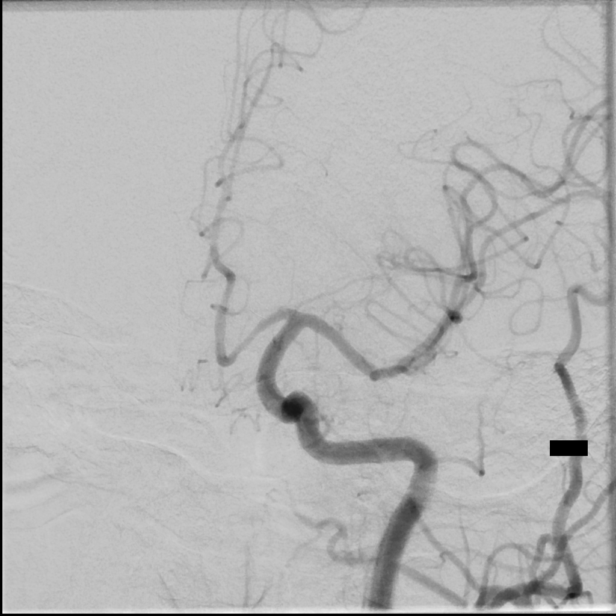
[im 81/206]
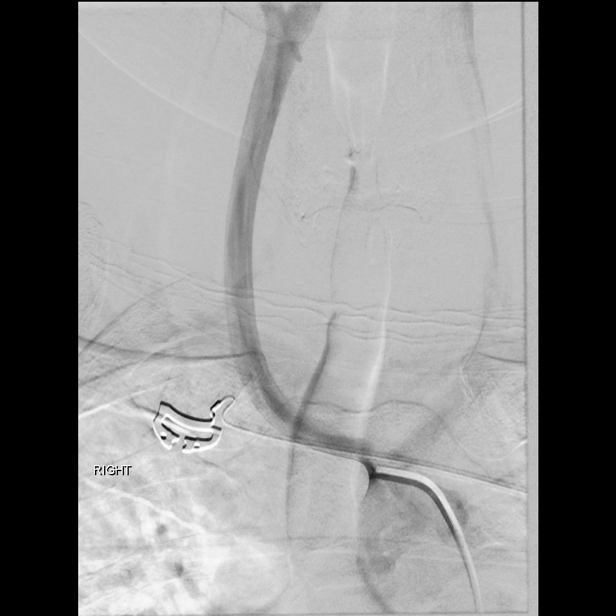
[im 99/206]
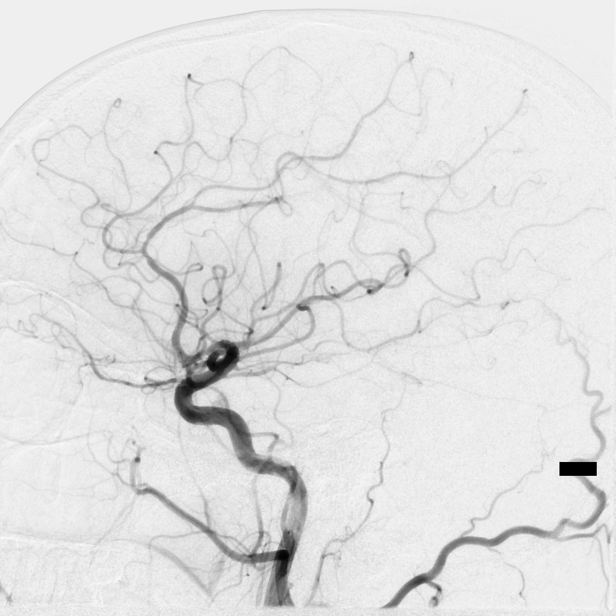
[im 116/206]
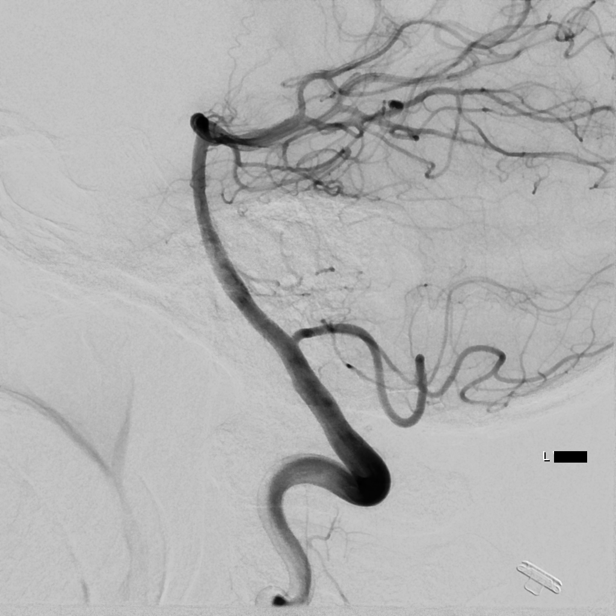
[im 134/206]
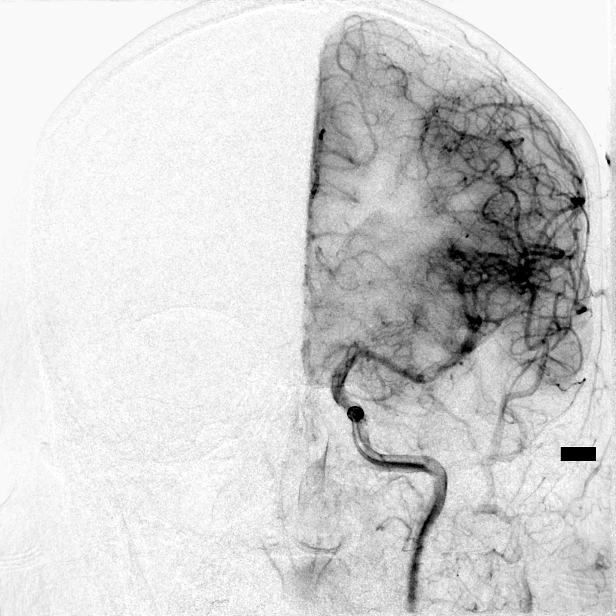
[im 152/206]
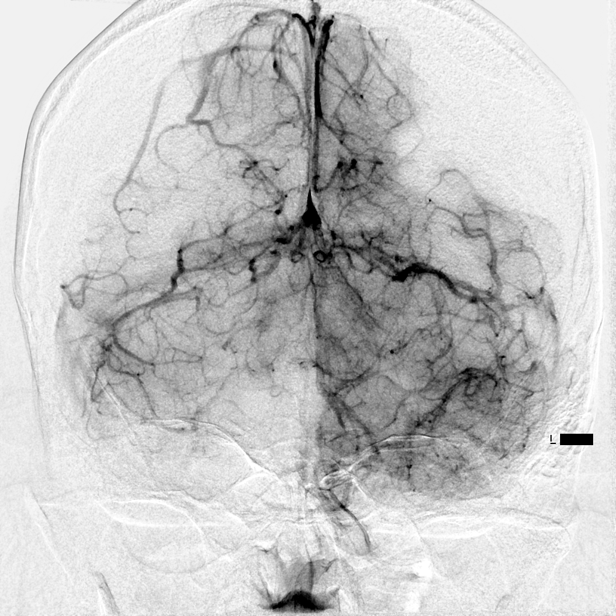
[im 170/206]
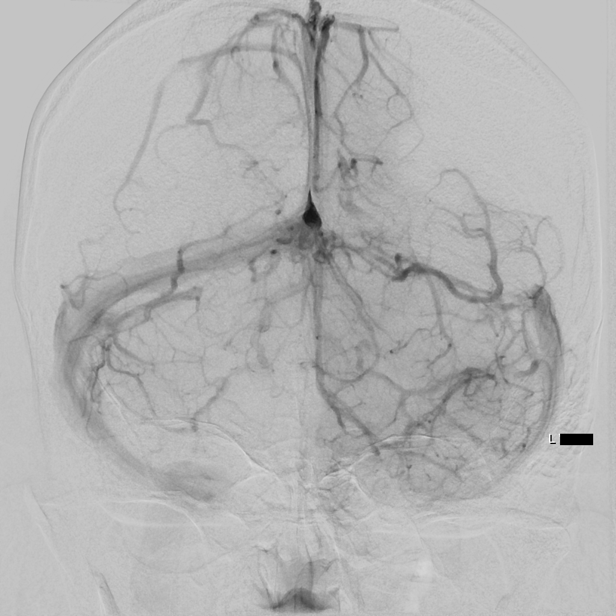
[im 188/206]
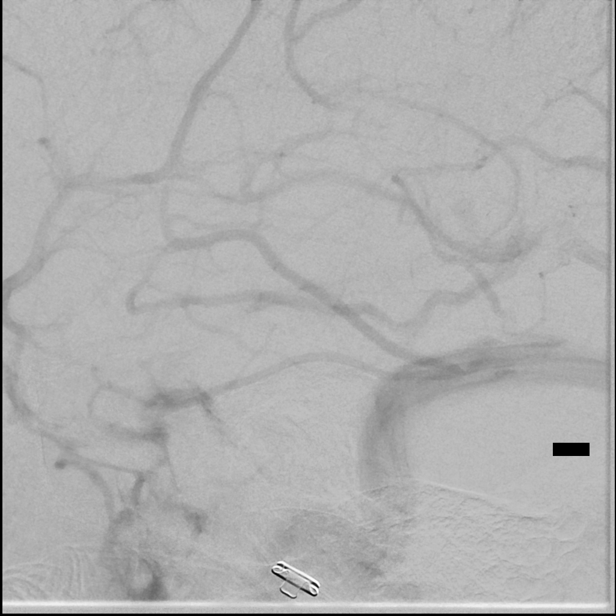
[im 206/206]
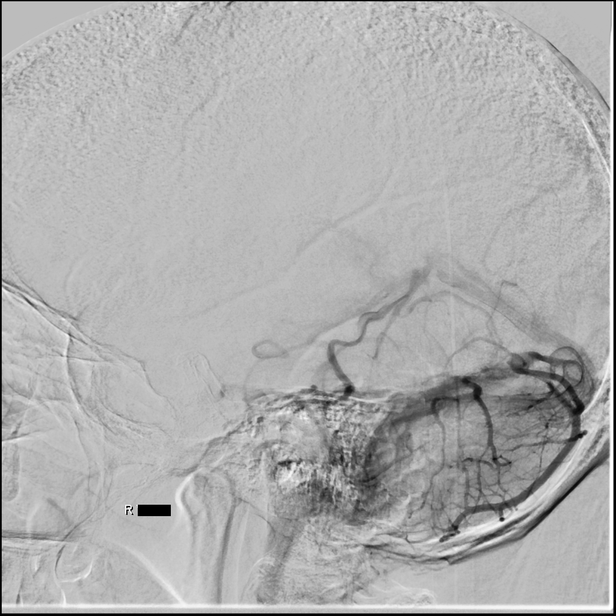

[12 of 24 positions shown; findings below may reference images not displayed]

MEDICATIONS:
Heparin 7111 units IV; no antibiotic was administered within 1 hour
of the procedure.

ANESTHESIA/SEDATION:
Versed 1 mg IV; Fentanyl 25 mcg IV

Moderate Sedation Time:  24 minutes

The patient was continuously monitored during the procedure by the
interventional radiology nurse under my direct supervision.

CONTRAST:  Isovue 300 approximately 60 mL.

FLUOROSCOPY TIME:  Fluoroscopy Time: 8 minutes 24 seconds (405 mGy).

COMPLICATIONS:
None immediate.
The right groin was prepped and draped in the usual sterile fashion.
Thereafter using modified Seldinger technique, transfemoral access
into the right common femoral artery was obtained without
difficulty. Over a 0.035 inch guidewire, a 5 French Pinnacle sheath
was inserted. Through this, and also over 0.035 inch guidewire, a 5
French JB 1 catheter was advanced to the aortic arch region and
selectively positioned in the right common carotid artery, the right
vertebral artery, the left common carotid artery and the left
vertebral artery.
FINDINGS: The right common carotid arteriogram demonstrates the right external
carotid artery and its major branches to be widely patent.

The right internal carotid artery at the bulb to the cranial skull
base demonstrates wide patency.

The petrous, cavernous and the supraclinoid segments are widely
patent.

The right middle cerebral artery proximally is widely patent. There
is a mild tapered narrowing of the proximal inferior division of the
right middle cerebral artery.

On the lateral projection, there are smooth focal areas of mild
caliber irregularity with narrowing involving the right pericallosal
artery in the A2 A3 region.

The left common carotid arteriogram demonstrates the left external
carotid artery and its major branches to be widely patent.

The left internal carotid artery at the bulb to the cranial skull
base demonstrates wide patency. The petrous, cavernous and the
supraclinoid segments are widely patent.

The left middle cerebral artery and the left anterior cerebral
artery opacify into the capillary and venous phases.

The left anterior cerebral A1 segment has a smooth moderate
stenosis. The left anterior cerebral artery pericallosal branch
again demonstrates a focal area of smooth caliber narrowing and
irregularity in the A2 A3 region.

The left vertebral artery origin is widely patent.

The vessel is seen to opacify to the cranial skull base. Wide
patency is seen of the left vertebrobasilar junction and the left
posteroinferior cerebellar artery.

The basilar artery, the posterior cerebral arteries, the superior
cerebellar arteries and the anterior-inferior cerebellar arteries
opacify into the capillary and venous phases.

The right subclavian artery is an aberrant origin artery. The right
vertebral artery arises from this vessel. The right vertebral artery
demonstrates normal origin with wide patency to the cranial skull
base. Wide patency is seen of the right posterior-inferior
cerebellar artery and the right vertebrobasilar junction.

The basilar artery, the posterior cerebral arteries, the superior
cerebellar arteries and the anterior-inferior cerebellar arteries
opacify into the capillary and venous phases.
IMPRESSION: Aberrant origin of the right subclavian artery a developmental
anomaly.

Focal areas of moderate caliber irregularity with stenosis involving
both pericallosal arteries, the left anterior cerebral artery A1
segment proximally, and the right middle cerebral artery inferior
division proximally.

These findings are nonspecific angiographically. Differential
consideration would be intracranial arteriosclerosis, versus
vasculitis phenomena, versus a drug-induced vasculitis or related to
an inflammatory process. Clinical correlation is recommended.

PLAN:
Follow-up with referring [facility].

## 2020-05-05 ENCOUNTER — Ambulatory Visit (INDEPENDENT_AMBULATORY_CARE_PROVIDER_SITE_OTHER): Payer: No Typology Code available for payment source | Admitting: Family Medicine

## 2020-05-05 ENCOUNTER — Other Ambulatory Visit: Payer: Self-pay

## 2020-05-05 ENCOUNTER — Encounter: Payer: Self-pay | Admitting: Family Medicine

## 2020-05-05 VITALS — BP 128/82 | HR 90 | Temp 98.1°F | Resp 14 | Ht 70.0 in | Wt 272.0 lb

## 2020-05-05 DIAGNOSIS — Z0001 Encounter for general adult medical examination with abnormal findings: Secondary | ICD-10-CM | POA: Diagnosis not present

## 2020-05-05 DIAGNOSIS — R7303 Prediabetes: Secondary | ICD-10-CM | POA: Diagnosis not present

## 2020-05-05 DIAGNOSIS — Z0000 Encounter for general adult medical examination without abnormal findings: Secondary | ICD-10-CM

## 2020-05-05 DIAGNOSIS — Z6839 Body mass index (BMI) 39.0-39.9, adult: Secondary | ICD-10-CM

## 2020-05-05 DIAGNOSIS — Z1159 Encounter for screening for other viral diseases: Secondary | ICD-10-CM

## 2020-05-05 DIAGNOSIS — E6609 Other obesity due to excess calories: Secondary | ICD-10-CM

## 2020-05-05 NOTE — Patient Instructions (Signed)
F/U 1 year for physical  

## 2020-05-05 NOTE — Progress Notes (Signed)
   Subjective:    Patient ID: Chad Moyer, male    DOB: [birth year], 44 y.o.   MRN: [id number]  Patient presents for Annual Exam (Is fasting)  Patient here for physical exam.  He is currently taking multivitamin. History of borderline diabetes mellitus last A1c was 5.8% last year in October.  History of high triglycerides which he improved with dietary changes.  He is due for recheck on lipid panel this year.  Immunizations tetanus up-to-date Covid vaccine up-to-date Declines flu shot.  Discussed hepatitis C screening  Follows with eye doctor and dentist   Tobacco Free for the past 4-5 months   No headaches in th epast year, was able to taper off ASA   Follows with dentist   Rides 3 days stationary bike , planning to change up his exercise routine, he enjoys running, biking outside- he lost a considerable amount of weight with these activites   Review Of Systems:  GEN- denies fatigue, fever, weight loss,weakness, recent illness HEENT- denies eye drainage, change in vision, nasal discharge, CVS- denies chest pain, palpitations RESP- denies SOB, cough, wheeze ABD- denies N/V, change in stools, abd pain GU- denies dysuria, hematuria, dribbling, incontinence MSK- denies joint pain, muscle aches, injury Neuro- denies headache, dizziness, syncope, seizure activity       Objective:    BP 128/82   Pulse 90   Temp 98.1 F (36.7 C) (Temporal)   Resp 14   Ht 5\' 10"  (1.778 m)   Wt 272 lb (123.4 kg)   SpO2 96%   BMI 39.03 kg/m  GEN- NAD, alert and oriented x3 ,obese  HEENT- PERRL, EOMI, non injected sclera, pink conjunctiva, MMM, oropharynx clear, TM Clear no effusion  Neck- Supple, no thyromegaly CVS- RRR, no murmur RESP-CTAB ABD-NABS,soft,NT,ND PSYCH Normal affect and mood  EXT- No edema Pulses- Radial, DP- 2+  FALL/AUDITC/Depression Screen negative         Assessment & Plan:      Problem List Items Addressed This Visit      Unprioritized    Borderline diabetes   Relevant Orders   Hemoglobin A1c   Class 2 obesity with body mass index (BMI) of 39.0 to 39.9 in adult    Other Visit Diagnoses    Routine general medical examination at a health care facility    -  Primary   CPE done, fasting labs obtained, discussed exercise routine, hep C screening, declines flu shot, otherwise prevention UTD   Relevant Orders   CBC with Differential/Platelet   Comprehensive metabolic panel   Lipid panel   Need for hepatitis C screening test       Relevant Orders   Hepatitis C antibody      Note: This dictation was prepared with Dragon dictation along with smaller phrase technology. Any transcriptional errors that result from this process are unintentional.

## 2020-05-06 LAB — HEMOGLOBIN A1C
Hgb A1c MFr Bld: 6.2 % of total Hgb — ABNORMAL HIGH (ref <5.7)
Mean Plasma Glucose: 131 mg/dL
eAG (mmol/L): 7.3 mmol/L

## 2020-05-06 LAB — COMPREHENSIVE METABOLIC PANEL
AG Ratio: 1.6 (calc) (ref 1.0–2.5)
ALT: 27 U/L (ref 9–46)
AST: 23 U/L (ref 10–40)
Albumin: 4.4 g/dL (ref 3.6–5.1)
Alkaline phosphatase (APISO): 66 U/L (ref 36–130)
BUN: 13 mg/dL (ref 7–25)
CO2: 24 mmol/L (ref 20–32)
Calcium: 9.4 mg/dL (ref 8.6–10.3)
Chloride: 104 mmol/L (ref 98–110)
Creat: 1.05 mg/dL (ref 0.60–1.35)
Globulin: 2.7 g/dL (calc) (ref 1.9–3.7)
Glucose, Bld: 101 mg/dL — ABNORMAL HIGH (ref 65–99)
Potassium: 4.3 mmol/L (ref 3.5–5.3)
Sodium: 140 mmol/L (ref 135–146)
Total Bilirubin: 0.4 mg/dL (ref 0.2–1.2)
Total Protein: 7.1 g/dL (ref 6.1–8.1)

## 2020-05-06 LAB — CBC WITH DIFFERENTIAL/PLATELET
Absolute Monocytes: 647 cells/uL (ref 200–950)
Basophils Absolute: 31 cells/uL (ref 0–200)
Basophils Relative: 0.4 %
Eosinophils Absolute: 92 cells/uL (ref 15–500)
Eosinophils Relative: 1.2 %
HCT: 46.7 % (ref 38.5–50.0)
Hemoglobin: 15.6 g/dL (ref 13.2–17.1)
Lymphs Abs: 2433 cells/uL (ref 850–3900)
MCH: 28.1 pg (ref 27.0–33.0)
MCHC: 33.4 g/dL (ref 32.0–36.0)
MCV: 84 fL (ref 80.0–100.0)
MPV: 9.8 fL (ref 7.5–12.5)
Monocytes Relative: 8.4 %
Neutro Abs: 4497 cells/uL (ref 1500–7800)
Neutrophils Relative %: 58.4 %
Platelets: 363 10*3/uL (ref 140–400)
RBC: 5.56 10*6/uL (ref 4.20–5.80)
RDW: 14.2 % (ref 11.0–15.0)
Total Lymphocyte: 31.6 %
WBC: 7.7 10*3/uL (ref 3.8–10.8)

## 2020-05-06 LAB — LIPID PANEL
Cholesterol: 150 mg/dL (ref <200)
HDL: 38 mg/dL — ABNORMAL LOW (ref >=40)
LDL Cholesterol (Calc): 88 mg/dL (calc)
Non-HDL Cholesterol (Calc): 112 mg/dL (calc) (ref <130)
Total CHOL/HDL Ratio: 3.9 (calc) (ref <5.0)
Triglycerides: 147 mg/dL (ref <150)

## 2020-05-06 LAB — HEPATITIS C ANTIBODY
Hepatitis C Ab: NONREACTIVE
SIGNAL TO CUT-OFF: 0.01 (ref <1.00)

## 2020-11-04 ENCOUNTER — Telehealth: Payer: Self-pay

## 2020-11-04 NOTE — Telephone Encounter (Signed)
Pt chart being reviewed by NP for est care decision

## 2020-11-04 NOTE — Telephone Encounter (Signed)
Please inform patient I can take over care. I have changed me to PCP in Epic.  Thanks.

## 2021-03-21 NOTE — Progress Notes (Signed)
Subjective:    Patient ID: Chad Moyer, male    DOB: 1976/02/17, 45 y.o.   MRN: [id number]  HPI: Chad Moyer is a 45 y.o. male presenting to establish care with new provider.  Previously saw Physician in the same office.  Chief Complaint  Patient presents with   Establish Care   PRE-DIABETES Hypoglycemic episodes:no Polydipsia/polyuria: no Visual disturbance: no Chest pain: no Paresthesias: no Glucose Monitoring: no Diabetic Education: Completed Physical activity: works - stays active with running, stooping, climbing  Dietary habits: does not eat sweets; eats what wife cooks or brings home Family history of diabetes no  He has been working a lot of hours recently.  No Known Allergies  Outpatient Encounter Medications as of 03/22/2021  Medication Sig   Multiple Vitamin (MULTIVITAMIN WITH MINERALS) TABS tablet Take 1 tablet by mouth daily.   No facility-administered encounter medications on file as of 03/22/2021.    Patient Active Problem List   Diagnosis Date Noted   Headache 11/27/2018   Prediabetes 02/21/2018   Hypertriglyceridemia 10/04/2016   Class 2 obesity with body mass index (BMI) of 39.0 to 39.9 in adult 06/24/2016   Elevated blood pressure reading in office without diagnosis of hypertension 06/24/2016    History reviewed. No pertinent past medical history.  Relevant past medical, surgical, family and social history reviewed and updated as indicated. Interim medical history since our last visit reviewed.  Review of Systems Per HPI unless specifically indicated above     Objective:    BP (!) 152/78 (BP Location: Left Arm, Cuff Size: Large)   Pulse 77   Temp 99 F (37.2 C) (Oral)   Resp 18   Ht 5\' 10"  (1.778 m)   Wt 266 lb (120.7 kg)   SpO2 98%   BMI 38.17 kg/m   Wt Readings from Last 3 Encounters:  03/22/21 266 lb (120.7 kg)  05/05/20 272 lb (123.4 kg)  03/06/19 271 lb (122.9 kg)    Physical Exam Vitals and nursing note  reviewed.  Constitutional:      General: He is not in acute distress.    Appearance: Normal appearance. He is obese. He is not toxic-appearing.  HENT:     Head: Normocephalic and atraumatic.  Cardiovascular:     Rate and Rhythm: Normal rate and regular rhythm.     Heart sounds: Normal heart sounds. No murmur heard. Pulmonary:     Effort: Pulmonary effort is normal. No respiratory distress.     Breath sounds: Normal breath sounds. No wheezing, rhonchi or rales.  Musculoskeletal:     Right lower leg: No edema.     Left lower leg: No edema.  Skin:    General: Skin is warm and dry.     Capillary Refill: Capillary refill takes less than 2 seconds.     Coloration: Skin is not jaundiced or pale.     Findings: No erythema.  Neurological:     Mental Status: He is alert and oriented to person, place, and time.     Motor: No weakness.     Gait: Gait normal.  Psychiatric:        Mood and Affect: Mood normal.        Behavior: Behavior normal.        Thought Content: Thought content normal.        Judgment: Judgment normal.       Assessment & Plan:   Problem List Items Addressed This Visit  Other   Prediabetes - Primary    Chronic.  Last A1c 1 year ago was 6.2%.  Will recheck today.  He does not eat sweets.  Follow up pending blood work.      Relevant Orders   Hemoglobin A1c   Elevated blood pressure reading in office without diagnosis of hypertension    BP came down 20 points while resting in office.  It looks like this has happened before in the past.  I have asked the patient to check his blood pressure at home and I gave him a prescription for an automatic large arm BP cuff.  Goal is less than 130/80 and patient will notify us with readings in 2 weeks.  Discussed long term effects of uncontrolled BP.  Also discussed dietary and lifestyle changes. Will plan to check labs at upcoming physical appointment as patient is not fasting today.        Follow up plan: Return for  after 12/21 for CPE with fasting labs.

## 2021-03-22 ENCOUNTER — Other Ambulatory Visit: Payer: Self-pay

## 2021-03-22 ENCOUNTER — Encounter: Payer: Self-pay | Admitting: Nurse Practitioner

## 2021-03-22 ENCOUNTER — Ambulatory Visit (INDEPENDENT_AMBULATORY_CARE_PROVIDER_SITE_OTHER): Payer: BC Managed Care – PPO | Admitting: Nurse Practitioner

## 2021-03-22 VITALS — BP 152/78 | HR 77 | Temp 99.0°F | Resp 18 | Ht 70.0 in | Wt 266.0 lb

## 2021-03-22 DIAGNOSIS — R030 Elevated blood-pressure reading, without diagnosis of hypertension: Secondary | ICD-10-CM

## 2021-03-22 DIAGNOSIS — R7303 Prediabetes: Secondary | ICD-10-CM | POA: Diagnosis not present

## 2021-03-22 NOTE — Assessment & Plan Note (Addendum)
BP came down 20 points while resting in office.  It looks like this has happened before in the past.  I have asked the patient to check his blood pressure at home and I gave him a prescription for an automatic large arm BP cuff.  Goal is less than 130/80 and patient will notify us with readings in 2 weeks.  Discussed long term effects of uncontrolled BP.  Also discussed dietary and lifestyle changes. Will plan to check labs at upcoming physical appointment as patient is not fasting today.

## 2021-03-22 NOTE — Assessment & Plan Note (Signed)
Chronic.  Last A1c 1 year ago was 6.2%.  Will recheck today.  He does not eat sweets.  Follow up pending blood work.

## 2021-03-23 LAB — HEMOGLOBIN A1C
Hgb A1c MFr Bld: 5.8 % of total Hgb — ABNORMAL HIGH (ref <5.7)
Mean Plasma Glucose: 120 mg/dL
eAG (mmol/L): 6.6 mmol/L

## 2021-08-24 ENCOUNTER — Encounter: Payer: Self-pay | Admitting: Family Medicine

## 2021-08-24 ENCOUNTER — Ambulatory Visit: Payer: BC Managed Care – PPO | Admitting: Family Medicine

## 2021-08-24 VITALS — BP 148/88 | HR 71 | Ht 69.0 in | Wt 271.2 lb

## 2021-08-24 DIAGNOSIS — E559 Vitamin D deficiency, unspecified: Secondary | ICD-10-CM | POA: Diagnosis not present

## 2021-08-24 DIAGNOSIS — R030 Elevated blood-pressure reading, without diagnosis of hypertension: Secondary | ICD-10-CM

## 2021-08-24 DIAGNOSIS — E781 Pure hyperglyceridemia: Secondary | ICD-10-CM

## 2021-08-24 DIAGNOSIS — Z7689 Persons encountering health services in other specified circumstances: Secondary | ICD-10-CM | POA: Diagnosis not present

## 2021-08-24 DIAGNOSIS — R7303 Prediabetes: Secondary | ICD-10-CM

## 2021-08-24 NOTE — Patient Instructions (Addendum)
I appreciate the opportunity to provide care to you today! ?  ?Follow up:  6 months for physcial exam ? ?labs: CBC, CMP, TSH, Lipid profile, Vit D,  HgA1c ? ?  ?Please continue to eat a heart-healthy diet and exercise for at least 30 mins daily. ? ?  ?It was a pleasure to see you and I look forward to continuing to work together on your health and well-being. ?Please do not hesitate to call the office if you need care or have questions about your care. ?  ?Have a wonderful day and week. ?With Gratitude, ?Gilmore Laroche MSN, FNP-BC ? ?

## 2021-08-24 NOTE — Assessment & Plan Note (Signed)
Pending labs

## 2021-08-24 NOTE — Assessment & Plan Note (Signed)
-  Pending labs ?-denies polyuria polydipsia and polyphagia ?

## 2021-08-24 NOTE — Progress Notes (Signed)
? ?New Patient Office Visit ? ?Subjective:  ?Patient ID: Chad Moyer, male    DOB: 1975/05/19  Age: 46 y.o. MRN: [id number] ? ?CC:  ?Chief Complaint  ?Patient presents with  ? New Patient (Initial Visit)  ?  Establishing care.   ? ? ?HPI ?Chad Moyer is a 46 y.o. male with past medical history of headaches, prediabetes,hyperlipidemia presents for establishing care.  The patient has no concerns or complaints at this time.  ? ?History reviewed. No pertinent past medical history. ? ?Past Surgical History:  ?Procedure Laterality Date  ? IR ANGIO INTRA EXTRACRAN SEL COM CAROTID INNOMINATE BILAT MOD SED  11/29/2018  ? IR ANGIO VERTEBRAL SEL VERTEBRAL BILAT MOD SED  11/29/2018  ? ? ?Family History  ?Problem Relation Age of Onset  ? Cancer Mother   ?     breast  ? Stroke Paternal Aunt   ? ? ?Social History  ? ?Socioeconomic History  ? Marital status: Married  ?  Spouse name: Not on file  ? Number of children: Not on file  ? Years of education: Not on file  ? Highest education level: Not on file  ?Occupational History  ? Not on file  ?Tobacco Use  ? Smoking status: Former  ?  Packs/day: 0.50  ?  Years: 12.00  ?  Pack years: 6.00  ?  Types: Cigarettes  ? Smokeless tobacco: Never  ? Tobacco comments:  ?  7 cigarettes per day  ?Vaping Use  ? Vaping Use: Never used  ?Substance and Sexual Activity  ? Alcohol use: Yes  ?  Comment: occasionally  ? Drug use: No  ? Sexual activity: Yes  ?  Birth control/protection: None  ?Other Topics Concern  ? Not on file  ?Social History Narrative  ? Not on file  ? ?Social Determinants of Health  ? ?Financial Resource Strain: Not on file  ?Food Insecurity: Not on file  ?Transportation Needs: Not on file  ?Physical Activity: Not on file  ?Stress: Not on file  ?Social Connections: Not on file  ?Intimate Partner Violence: Not on file  ? ? ?ROS ?Review of Systems  ?Constitutional:  Negative for chills, fatigue and fever.  ?HENT:  Negative for postnasal drip, rhinorrhea, sinus pressure,  sinus pain and sore throat.   ?Eyes:  Negative for pain, redness and itching.  ?Respiratory:  Negative for cough, choking, chest tightness and shortness of breath.   ?Cardiovascular:  Negative for chest pain and palpitations.  ?Gastrointestinal:  Negative for constipation, diarrhea, nausea and vomiting.  ?Endocrine: Negative for polydipsia, polyphagia and polyuria.  ?Genitourinary:  Negative for hematuria, penile discharge, penile pain and penile swelling.  ?Musculoskeletal:  Negative for back pain and joint swelling.  ?Skin:  Negative for pallor and rash.  ?Neurological:  Negative for tremors, weakness, numbness and headaches.  ?Psychiatric/Behavioral:  Negative for self-injury and suicidal ideas.   ? ?Objective:  ? ?Today's Vitals: BP (!) 148/88 (BP Location: Right Arm)   Pulse 71   Ht 5' 9"  (1.753 m)   Wt 271 lb 3.2 oz (123 kg)   SpO2 97%   BMI 40.05 kg/m?  ? ?Physical Exam ?Constitutional:   ?   Appearance: Normal appearance.  ?HENT:  ?   Head: Normocephalic.  ?   Nose: No congestion or rhinorrhea.  ?   Mouth/Throat:  ?   Mouth: Mucous membranes are moist.  ?   Pharynx: No oropharyngeal exudate or posterior oropharyngeal erythema.  ?Eyes:  ?   General:     ?  Right eye: No discharge.     ?   Left eye: No discharge.  ?   Extraocular Movements: Extraocular movements intact.  ?   Pupils: Pupils are equal, round, and reactive to light.  ?Cardiovascular:  ?   Rate and Rhythm: Normal rate and regular rhythm.  ?   Pulses: Normal pulses.  ?   Heart sounds: Normal heart sounds. No murmur heard. ?Pulmonary:  ?   Effort: Pulmonary effort is normal.  ?   Breath sounds: Normal breath sounds. No rhonchi.  ?Abdominal:  ?   General: Abdomen is flat. Bowel sounds are normal.  ?   Palpations: Abdomen is soft.  ?Musculoskeletal:     ?   General: No signs of injury. Normal range of motion.  ?   Cervical back: Normal range of motion.  ?   Right lower leg: No edema.  ?   Left lower leg: No edema.  ?Skin: ?   General: Skin is  warm.  ?   Capillary Refill: Capillary refill takes less than 2 seconds.  ?   Coloration: Skin is not jaundiced.  ?   Findings: No erythema or rash.  ?Neurological:  ?   Mental Status: He is alert and oriented to person, place, and time.  ?   Motor: No weakness.  ?   Gait: Gait normal.  ?Psychiatric:  ?   Comments: Normal affect  ? ? ?Assessment & Plan:  ? ?Problem List Items Addressed This Visit   ? ?  ? Other  ? Elevated blood pressure reading in office without diagnosis of hypertension  ?  -BP elevated in the office today ?-Pt states his elevated BP might be due to him going to work after his -appt ?-Educated patient on normal BP recommendation  ?-We will continue to monitor pt's BP at the subsequent visit ?  ?  ? Hypertriglyceridemia  ?  -Pending labs ? ?  ?  ? Prediabetes  ?  -Pending labs ?-denies polyuria polydipsia and polyphagia ?  ?  ? Encounter to establish care  ?  Physical exam performed ?Record requested ?Pending labs ? ?  ?  ? ?Other Visit Diagnoses   ? ? Establishing care with new doctor, encounter for    -  Primary  ? Relevant Orders  ? Hemoglobin A1c  ? Lipid panel  ? TSH + free T4  ? CMP14+EGFR  ? CBC with Differential/Platelet  ? Vitamin D deficiency      ? Relevant Orders  ? VITAMIN D 25 Hydroxy (Vit-D Deficiency, Fractures)  ? ?  ? ? ?Outpatient Encounter Medications as of 08/24/2021  ?Medication Sig  ? Multiple Vitamin (MULTIVITAMIN WITH MINERALS) TABS tablet Take 1 tablet by mouth daily.  ? ?No facility-administered encounter medications on file as of 08/24/2021.  ? ? ?Follow-up: Return in about 6 months (around 02/23/2022) for physcial exam.  ? ?Chad Monday, FNP ?

## 2021-08-24 NOTE — Assessment & Plan Note (Signed)
Physical exam performed ?Record requested ?Pending labs ? ?

## 2021-08-24 NOTE — Assessment & Plan Note (Signed)
-  BP elevated in the office today ?-Pt states his elevated BP might be due to him going to work after his -appt ?-Educated patient on normal BP recommendation  ?-We will continue to monitor pt's BP at the subsequent visit ?

## 2021-08-25 ENCOUNTER — Other Ambulatory Visit: Payer: Self-pay | Admitting: Family Medicine

## 2021-08-25 LAB — CMP14+EGFR
ALT: 29 IU/L (ref 0–44)
AST: 24 IU/L (ref 0–40)
Albumin/Globulin Ratio: 1.5 (ref 1.2–2.2)
Albumin: 4.4 g/dL (ref 4.0–5.0)
Alkaline Phosphatase: 76 IU/L (ref 44–121)
BUN/Creatinine Ratio: 10 (ref 9–20)
BUN: 11 mg/dL (ref 6–24)
Bilirubin Total: <0.2 mg/dL (ref 0.0–1.2)
CO2: 24 mmol/L (ref 20–29)
Calcium: 9.4 mg/dL (ref 8.7–10.2)
Chloride: 101 mmol/L (ref 96–106)
Creatinine, Ser: 1.05 mg/dL (ref 0.76–1.27)
Globulin, Total: 2.9 g/dL (ref 1.5–4.5)
Glucose: 101 mg/dL — ABNORMAL HIGH (ref 70–99)
Potassium: 4.4 mmol/L (ref 3.5–5.2)
Sodium: 138 mmol/L (ref 134–144)
Total Protein: 7.3 g/dL (ref 6.0–8.5)
eGFR: 89 mL/min/{1.73_m2} (ref >59)

## 2021-08-25 LAB — CBC WITH DIFFERENTIAL/PLATELET
Basophils Absolute: 0.1 10*3/uL (ref 0.0–0.2)
Basos: 1 %
EOS (ABSOLUTE): 0.1 10*3/uL (ref 0.0–0.4)
Eos: 2 %
Hematocrit: 48.3 % (ref 37.5–51.0)
Hemoglobin: 16.1 g/dL (ref 13.0–17.7)
Immature Grans (Abs): 0 10*3/uL (ref 0.0–0.1)
Immature Granulocytes: 0 %
Lymphocytes Absolute: 3 10*3/uL (ref 0.7–3.1)
Lymphs: 40 %
MCH: 28.3 pg (ref 26.6–33.0)
MCHC: 33.3 g/dL (ref 31.5–35.7)
MCV: 85 fL (ref 79–97)
Monocytes Absolute: 0.7 10*3/uL (ref 0.1–0.9)
Monocytes: 9 %
Neutrophils Absolute: 3.5 10*3/uL (ref 1.4–7.0)
Neutrophils: 48 %
Platelets: 385 10*3/uL (ref 150–450)
RBC: 5.68 x10E6/uL (ref 4.14–5.80)
RDW: 13.8 % (ref 11.6–15.4)
WBC: 7.4 10*3/uL (ref 3.4–10.8)

## 2021-08-25 LAB — LIPID PANEL
Chol/HDL Ratio: 4.1 ratio (ref 0.0–5.0)
Cholesterol, Total: 162 mg/dL (ref 100–199)
HDL: 40 mg/dL (ref >39)
LDL Chol Calc (NIH): 92 mg/dL (ref 0–99)
Triglycerides: 170 mg/dL — ABNORMAL HIGH (ref 0–149)
VLDL Cholesterol Cal: 30 mg/dL (ref 5–40)

## 2021-08-25 LAB — TSH+FREE T4
Free T4: 1.24 ng/dL (ref 0.82–1.77)
TSH: 3.33 u[IU]/mL (ref 0.450–4.500)

## 2021-08-25 LAB — HEMOGLOBIN A1C
Est. average glucose Bld gHb Est-mCnc: 128 mg/dL
Hgb A1c MFr Bld: 6.1 % — ABNORMAL HIGH (ref 4.8–5.6)

## 2021-08-25 LAB — VITAMIN D 25 HYDROXY (VIT D DEFICIENCY, FRACTURES): Vit D, 25-Hydroxy: 7.5 ng/mL — ABNORMAL LOW (ref 30.0–100.0)

## 2021-08-25 MED ORDER — VITAMIN D (ERGOCALCIFEROL) 1.25 MG (50000 UNIT) PO CAPS: 50000.0000 [IU] | ORAL_CAPSULE | ORAL | 1 refills | Status: DC

## 2022-02-23 ENCOUNTER — Encounter: Payer: BC Managed Care – PPO | Admitting: Family Medicine

## 2022-02-28 ENCOUNTER — Encounter: Payer: Self-pay | Admitting: Family Medicine

## 2022-02-28 ENCOUNTER — Ambulatory Visit (INDEPENDENT_AMBULATORY_CARE_PROVIDER_SITE_OTHER): Payer: BC Managed Care – PPO | Admitting: Family Medicine

## 2022-02-28 VITALS — BP 152/96 | HR 86 | Ht 69.0 in | Wt 273.1 lb

## 2022-02-28 DIAGNOSIS — Z1211 Encounter for screening for malignant neoplasm of colon: Secondary | ICD-10-CM | POA: Diagnosis not present

## 2022-02-28 DIAGNOSIS — I10 Essential (primary) hypertension: Secondary | ICD-10-CM | POA: Diagnosis not present

## 2022-02-28 DIAGNOSIS — Z0001 Encounter for general adult medical examination with abnormal findings: Secondary | ICD-10-CM | POA: Diagnosis not present

## 2022-02-28 DIAGNOSIS — Z2821 Immunization not carried out because of patient refusal: Secondary | ICD-10-CM | POA: Diagnosis not present

## 2022-02-28 MED ORDER — OLMESARTAN MEDOXOMIL 20 MG PO TABS: 20.0000 mg | ORAL_TABLET | Freq: Every day | ORAL | 1 refills | Status: DC

## 2022-02-28 NOTE — Assessment & Plan Note (Signed)
Physical exam as documented Counseling is done on healthy lifestyle involving commitment to 150 minutes of exercise per week,  Discussed heart-healthy diet and attaining a healthy weight Changes in health habits are decided on by the patient with goals and time frames set for achieving them. Immunization and cancer screening needs are specifically addressed at this visit  he refuses flu vaccine today

## 2022-02-28 NOTE — Progress Notes (Signed)
Complete physical exam  Patient: Chad Moyer   DOB: 04-02-1976   46 y.o. Male  MRN: [id number]  Subjective:    Chief Complaint  Patient presents with   Annual Exam    Cpe today, no concerns.     Chad Moyer is a 46 y.o. male who presents today for a complete physical exam. He reports consuming a general diet.  He reports that he gets 11,000 steps daily at work.  He generally feels well. He reports getting 4 to 5 hours of sleep because he works night shift.poorly. He does not have additional problems to discuss today.    Most recent fall risk assessment:    02/28/2022    9:24 AM  [place] [employer] in the past year? 0  Number falls in past yr: 0  Injury with Fall? 0  Risk for fall due to : No Fall Risks  Follow up Falls evaluation completed     Most recent depression screenings:    02/28/2022    9:24 AM 08/24/2021    3:16 PM  PHQ 2/9 Scores  PHQ - 2 Score 0 0    Dental: No current dental problems and Last dental visit: April, 2023  Patient Active Problem List   Diagnosis Date Noted   Primary hypertension 02/28/2022   Encounter for general adult medical examination with abnormal findings 02/28/2022   Encounter to establish care 08/24/2021   Headache 11/27/2018   Prediabetes 02/21/2018   Hypertriglyceridemia 10/04/2016   Class 2 obesity with body mass index (BMI) of 39.0 to 39.9 in adult 06/24/2016   Elevated blood pressure reading in office without diagnosis of hypertension 06/24/2016   History reviewed. No pertinent past medical history. Past Surgical History:  Procedure Laterality Date   IR ANGIO INTRA EXTRACRAN SEL COM CAROTID INNOMINATE BILAT MOD SED  11/29/2018   IR ANGIO VERTEBRAL SEL VERTEBRAL BILAT MOD SED  11/29/2018   Social History   Tobacco Use   Smoking status: Some Days    Packs/day: 0.50    Types: Cigarettes   Smokeless tobacco: Never   Tobacco comments:    3 cig a day some days.  Vaping Use   Vaping Use: Never used   Substance Use Topics   Alcohol use: Yes    Comment: occasionally   Drug use: No   Social History   Socioeconomic History   Marital status: Married    Spouse name: Not on file   Number of children: Not on file   Years of education: Not on file   Highest education level: Not on file  Occupational History   Not on file  Tobacco Use   Smoking status: Some Days    Packs/day: 0.50    Types: Cigarettes   Smokeless tobacco: Never   Tobacco comments:    3 cig a day some days.  Vaping Use   Vaping Use: Never used  Substance and Sexual Activity   Alcohol use: Yes    Comment: occasionally   Drug use: No   Sexual activity: Yes    Birth control/protection: None  Other Topics Concern   Not on file  Social History Narrative   Not on file   Social Determinants of Health   Financial Resource Strain: Not on file  Food Insecurity: Not on file  Transportation Needs: Not on file  Physical Activity: Not on file  Stress: Not on file  Social Connections: Not on file  Intimate Partner Violence: Not on  file   Family Status  Relation Name Status   Mother  Alive   Father  Alive   Ethlyn Daniels  (Not Specified)   MGM  Deceased   MGF  Deceased   PGM  Deceased   PGF  Deceased   Sister  Alive   Daughter  108   Son  Alive   Sister  Alive   Family History  Problem Relation Age of Onset   Cancer Mother        breast   Stroke Paternal Aunt    No Known Allergies    Patient Care Team: Alvira Monday, FNP as PCP - General (Family Medicine)   Outpatient Medications Prior to Visit  Medication Sig   Multiple Vitamin (MULTIVITAMIN WITH MINERALS) TABS tablet Take 1 tablet by mouth daily.   Vitamin D, Ergocalciferol, (DRISDOL) 1.25 MG (50000 UNIT) CAPS capsule Take 1 capsule (50,000 Units total) by mouth every 7 (seven) days.   No facility-administered medications prior to visit.    Review of Systems  Constitutional:  Negative for chills and fever.  HENT:  Negative for sinus pain  and sore throat.   Eyes:  Negative for double vision and photophobia.  Respiratory:  Negative for sputum production and shortness of breath.   Cardiovascular:  Negative for chest pain.  Gastrointestinal:  Negative for nausea and vomiting.  Genitourinary:  Negative for hematuria and urgency.  Musculoskeletal:  Negative for falls and joint pain.  Skin:  Negative for itching.  Neurological:  Negative for dizziness and headaches.  Psychiatric/Behavioral:  Negative for memory loss and suicidal ideas.           Objective:     BP (!) 152/96 (BP Location: Left Arm)   Pulse 86   Ht _0  (1.753 m)   Wt 273 lb 1.9 oz (123.9 kg)   SpO2 98%   BMI 40.33 kg/m  BP Readings from Last 3 Encounters:  02/28/22 (!) 152/96  08/24/21 (!) 148/88  03/22/21 (!) 152/78   Wt Readings from Last 3 Encounters:  02/28/22 273 lb 1.9 oz (123.9 kg)  08/24/21 271 lb 3.2 oz (123 kg)  03/22/21 266 lb (120.7 kg)      Physical Exam HENT:     Head: Normocephalic.     Right Ear: External ear normal.     Left Ear: External ear normal.     Nose: No congestion.     Mouth/Throat:     Mouth: Mucous membranes are moist.  Eyes:     Extraocular Movements: Extraocular movements intact.     Pupils: Pupils are equal, round, and reactive to light.  Cardiovascular:     Rate and Rhythm: Normal rate and regular rhythm.     Pulses: Normal pulses.     Heart sounds: Normal heart sounds.  Pulmonary:     Effort: Pulmonary effort is normal.  Abdominal:     Palpations: Abdomen is soft.     Tenderness: There is no right CVA tenderness or left CVA tenderness.  Musculoskeletal:     Cervical back: No rigidity.  Skin:    Findings: No lesion or rash.  Neurological:     Mental Status: He is alert and oriented to person, place, and time.     Cranial Nerves: No facial asymmetry.     Sensory: Sensation is intact.     Motor: No weakness.     Coordination: Romberg sign negative. Coordination normal. Finger-Nose-Finger Test  normal.     Gait: Gait is  intact.      No results found for any visits on 02/28/22. Last CBC Lab Results  Component Value Date   WBC 7.4 08/24/2021   HGB 16.1 08/24/2021   HCT 48.3 08/24/2021   MCV 85 08/24/2021   MCH 28.3 08/24/2021   RDW 13.8 08/24/2021   PLT 385 73/41/9379   Last metabolic panel Lab Results  Component Value Date   GLUCOSE 101 (H) 08/24/2021   NA 138 08/24/2021   K 4.4 08/24/2021   CL 101 08/24/2021   CO2 24 08/24/2021   BUN 11 08/24/2021   CREATININE 1.05 08/24/2021   EGFR 89 08/24/2021   CALCIUM 9.4 08/24/2021   PROT 7.3 08/24/2021   ALBUMIN 4.4 08/24/2021   LABGLOB 2.9 08/24/2021   AGRATIO 1.5 08/24/2021   BILITOT <0.2 08/24/2021   ALKPHOS 76 08/24/2021   AST 24 08/24/2021   ALT 29 08/24/2021   ANIONGAP 10 11/28/2018   Last lipids Lab Results  Component Value Date   CHOL 162 08/24/2021   HDL 40 08/24/2021   LDLCALC 92 08/24/2021   TRIG 170 (H) 08/24/2021   CHOLHDL 4.1 08/24/2021   Last hemoglobin A1c Lab Results  Component Value Date   HGBA1C 6.1 (H) 08/24/2021   Last thyroid functions Lab Results  Component Value Date   TSH 3.330 08/24/2021   Last vitamin D Lab Results  Component Value Date   VD25OH 7.5 (L) 08/24/2021   Last vitamin B12 and Folate No results found for: "VITAMINB12", "FOLATE"      Assessment & Plan:    Routine Health Maintenance and Physical Exam  Immunization History  Administered Date(s) Administered   Influenza-Unspecified 03/03/2021   PFIZER(Purple Top)SARS-COV-2 Vaccination 01/10/2020, 01/31/2020   Tdap 06/24/2016    Health Maintenance  Topic Date Due   COVID-19 Vaccine (3 - Pfizer series) 03/27/2020   COLONOSCOPY (Pts 45-79yr Insurance coverage will need to be confirmed)  Never done   TETANUS/TDAP  06/24/2026   Hepatitis C Screening  Completed   HIV Screening  Completed   HPV VACCINES  Aged Out   INFLUENZA VACCINE  Discontinued    Discussed health benefits of physical activity, and  encouraged him to engage in regular exercise appropriate for his age and condition.  Problem List Items Addressed This Visit       Cardiovascular and Mediastinum   Primary hypertension    Uncontrolled He denies headaches, dizziness, blurred vision, chest pain, and palpitations He reports getting food on the go, as he has a very hectic schedule Will start patient on olmesartan 20 mg today We will follow up on his blood pressure in 4 weeks Dash diet reviewed Recommended decreasing his sodium intake to less than 15,000 mg daily Recommended increasing his physical activities by walking at least 30 minutes daily We will get basic labs in 4 weeks BP Readings from Last 3 Encounters:  02/28/22 (!) 152/96  08/24/21 (!) 148/88  03/22/21 (!) 152/78        Relevant Medications   olmesartan (BENICAR) 20 MG tablet     Other   Encounter for general adult medical examination with abnormal findings - Primary    Physical exam as documented Counseling is done on healthy lifestyle involving commitment to 150 minutes of exercise per week,  Discussed heart-healthy diet and attaining a healthy weight Changes in health habits are decided on by the patient with goals and time frames set for achieving them. Immunization and cancer screening needs are specifically addressed at this visit  he refuses flu vaccine today         Other Visit Diagnoses     Colon cancer screening       Relevant Orders   Fecal occult blood, imunochemical   Refused influenza vaccine          Return in about 1 month (around 03/31/2022).     Alvira Monday, FNP

## 2022-02-28 NOTE — Patient Instructions (Signed)
I appreciate the opportunity to provide care to you today!    Follow up:  4 weeks  Labs: Will get labs at your next visit  Please pick up your blood pressure medication at the pharmacy  -Please be Informed that the first 2 to 3 days of therapy you will experience some lightheadedness and dizziness and this is because your body is adjusting to the lowering of your blood pressure   Please continue to a heart-healthy diet and increase your physical activities. Try to exercise for 65mins at least three times a week.      It was a pleasure to see you and I look forward to continuing to work together on your health and well-being. Please do not hesitate to call the office if you need care or have questions about your care.   Have a wonderful day and week. With Gratitude, Alvira Monday MSN, FNP-BC

## 2022-02-28 NOTE — Assessment & Plan Note (Addendum)
Uncontrolled He denies headaches, dizziness, blurred vision, chest pain, and palpitations He reports getting food on the go, as he has a very hectic schedule Will start patient on olmesartan 20 mg today We will follow up on his blood pressure in 4 weeks Dash diet reviewed Recommended decreasing his sodium intake to less than 15,000 mg daily Recommended increasing his physical activities by walking at least 30 minutes daily We will get basic labs in 4 weeks BP Readings from Last 3 Encounters:  02/28/22 (!) 152/96  08/24/21 (!) 148/88  03/22/21 (!) 152/78

## 2022-03-31 ENCOUNTER — Ambulatory Visit: Payer: BC Managed Care – PPO | Admitting: Family Medicine

## 2022-03-31 ENCOUNTER — Encounter: Payer: Self-pay | Admitting: Family Medicine

## 2022-03-31 VITALS — BP 140/90 | HR 79 | Ht 69.0 in | Wt 272.0 lb

## 2022-03-31 DIAGNOSIS — E7849 Other hyperlipidemia: Secondary | ICD-10-CM | POA: Diagnosis not present

## 2022-03-31 DIAGNOSIS — R7303 Prediabetes: Secondary | ICD-10-CM

## 2022-03-31 DIAGNOSIS — E559 Vitamin D deficiency, unspecified: Secondary | ICD-10-CM | POA: Diagnosis not present

## 2022-03-31 DIAGNOSIS — R7301 Impaired fasting glucose: Secondary | ICD-10-CM | POA: Diagnosis not present

## 2022-03-31 DIAGNOSIS — E038 Other specified hypothyroidism: Secondary | ICD-10-CM

## 2022-03-31 DIAGNOSIS — I10 Essential (primary) hypertension: Secondary | ICD-10-CM

## 2022-03-31 MED ORDER — OLMESARTAN MEDOXOMIL 40 MG PO TABS: 40.0000 mg | ORAL_TABLET | Freq: Every day | ORAL | 1 refills | Status: DC

## 2022-03-31 NOTE — Assessment & Plan Note (Addendum)
Uncontrolled Will increase his olmesartan to 40 mg today Encouraged patient to start taking olmesartan 40 mg daily Dash diet reviewed Encouraged low-sodium diet, with increased physical activities Will assess his CBC and CMP today BP Readings from Last 3 Encounters:  03/31/22 (!) 140/90  02/28/22 (!) 152/96  08/24/21 (!) 148/88

## 2022-03-31 NOTE — Progress Notes (Signed)
Established Patient Office Visit  Subjective:  Patient ID: Chad Moyer, male    DOB: 1975/09/22  Age: 46 y.o. MRN: [id number]  CC:  Chief Complaint  Patient presents with   Follow-up    4 week f/u for bp check, has been doing well with medication.     HPI Chad Moyer is a 46 y.o. male with past medical history of primary hypertension, prediabetes, hypertriglyceridemia presents for f/u of  chronic medical conditions.   Hypertension: He takes olmesartan 20 mg daily.He reports adherence with the treatment regimen.  He reports a low-sodium diet with minimal physical activities due to his work schedule.  He reports working 8 days of 12-hour shift.  He denies dizziness, headaches, blurred vision, chest pain, and palpitation.   History reviewed. No pertinent past medical history.  Past Surgical History:  Procedure Laterality Date   IR ANGIO INTRA EXTRACRAN SEL COM CAROTID INNOMINATE BILAT MOD SED  11/29/2018   IR ANGIO VERTEBRAL SEL VERTEBRAL BILAT MOD SED  11/29/2018    Family History  Problem Relation Age of Onset   Cancer Mother        breast   Stroke Paternal Aunt     Social History   Socioeconomic History   Marital status: Married    Spouse name: Not on file   Number of children: Not on file   Years of education: Not on file   Highest education level: Not on file  Occupational History   Not on file  Tobacco Use   Smoking status: Some Days    Packs/day: 0.50    Types: Cigarettes   Smokeless tobacco: Never   Tobacco comments:    3 cig a day some days.  Vaping Use   Vaping Use: Never used  Substance and Sexual Activity   Alcohol use: Yes    Comment: occasionally   Drug use: No   Sexual activity: Yes    Birth control/protection: None  Other Topics Concern   Not on file  Social History Narrative   Not on file   Social Determinants of Health   Financial Resource Strain: Not on file  Food Insecurity: Not on file  Transportation Needs: Not  on file  Physical Activity: Not on file  Stress: Not on file  Social Connections: Not on file  Intimate Partner Violence: Not on file    Outpatient Medications Prior to Visit  Medication Sig Dispense Refill   Multiple Vitamin (MULTIVITAMIN WITH MINERALS) TABS tablet Take 1 tablet by mouth daily.     olmesartan (BENICAR) 20 MG tablet Take 1 tablet (20 mg total) by mouth daily. 30 tablet 1   Vitamin D, Ergocalciferol, (DRISDOL) 1.25 MG (50000 UNIT) CAPS capsule Take 1 capsule (50,000 Units total) by mouth every 7 (seven) days. (Patient not taking: Reported on 03/31/2022) 12 capsule 1   No facility-administered medications prior to visit.    No Known Allergies  ROS Review of Systems  Constitutional:  Negative for chills and fatigue.  Eyes:  Negative for visual disturbance.  Respiratory:  Negative for chest tightness and shortness of breath.   Cardiovascular:  Negative for chest pain and palpitations.  Neurological:  Negative for dizziness and headaches.      Objective:    Physical Exam HENT:     Head: Normocephalic.     Right Ear: External ear normal.     Left Ear: External ear normal.  Cardiovascular:     Rate and Rhythm: Normal rate and regular rhythm.  Pulses: Normal pulses.     Heart sounds: Normal heart sounds. No murmur heard. Pulmonary:     Effort: Pulmonary effort is normal.     Breath sounds: Normal breath sounds. No wheezing.  Neurological:     Mental Status: He is alert.     BP (!) 140/90   Pulse 79   Ht _0  (1.753 m)   Wt 272 lb 0.6 oz (123.4 kg)   SpO2 97%   BMI 40.17 kg/m  Wt Readings from Last 3 Encounters:  03/31/22 272 lb 0.6 oz (123.4 kg)  02/28/22 273 lb 1.9 oz (123.9 kg)  08/24/21 271 lb 3.2 oz (123 kg)    Lab Results  Component Value Date   TSH 3.330 08/24/2021   Lab Results  Component Value Date   WBC 7.4 08/24/2021   HGB 16.1 08/24/2021   HCT 48.3 08/24/2021   MCV 85 08/24/2021   PLT 385 08/24/2021   Lab Results   Component Value Date   NA 138 08/24/2021   K 4.4 08/24/2021   CO2 24 08/24/2021   GLUCOSE 101 (H) 08/24/2021   BUN 11 08/24/2021   CREATININE 1.05 08/24/2021   BILITOT <0.2 08/24/2021   ALKPHOS 76 08/24/2021   AST 24 08/24/2021   ALT 29 08/24/2021   PROT 7.3 08/24/2021   ALBUMIN 4.4 08/24/2021   CALCIUM 9.4 08/24/2021   ANIONGAP 10 11/28/2018   EGFR 89 08/24/2021   Lab Results  Component Value Date   CHOL 162 08/24/2021   Lab Results  Component Value Date   HDL 40 08/24/2021   Lab Results  Component Value Date   LDLCALC 92 08/24/2021   Lab Results  Component Value Date   TRIG 170 (H) 08/24/2021   Lab Results  Component Value Date   CHOLHDL 4.1 08/24/2021   Lab Results  Component Value Date   HGBA1C 6.1 (H) 08/24/2021      Assessment & Plan:   Problem List Items Addressed This Visit       Cardiovascular and Mediastinum   Primary hypertension - Primary    Uncontrolled Will increase his olmesartan to 40 mg today Encouraged patient to start taking olmesartan 40 mg daily Dash diet reviewed Encouraged low-sodium diet, with increased physical activities Will assess his CBC and CMP today BP Readings from Last 3 Encounters:  03/31/22 (!) 140/90  02/28/22 (!) 152/96  08/24/21 (!) 148/88          Relevant Medications   olmesartan (BENICAR) 40 MG tablet   Other Relevant Orders   CMP14+EGFR   CBC with Differential/Platelet     Other   Prediabetes   Other Visit Diagnoses     IFG (impaired fasting glucose)       Relevant Orders   Hemoglobin A1c   Other hyperlipidemia       Relevant Medications   olmesartan (BENICAR) 40 MG tablet   Other Relevant Orders   Lipid panel   Vitamin D deficiency       Relevant Orders   VITAMIN D 25 Hydroxy (Vit-D Deficiency, Fractures)   Other specified hypothyroidism       Relevant Orders   TSH + free T4       Meds ordered this encounter  Medications   olmesartan (BENICAR) 40 MG tablet    Sig: Take 1 tablet  (40 mg total) by mouth daily.    Dispense:  30 tablet    Refill:  1    Follow-up: Return in about 4  weeks (around 04/28/2022) for BP.    Alvira Monday, FNP

## 2022-03-31 NOTE — Patient Instructions (Signed)
I appreciate the opportunity to provide care to you today!    Follow up:  1 months  Labs: please stop by the lab today to get your blood drawn (CBC, CMP, TSH, Lipid profile, HgA1c, Vit D)   Please pick up your medications at the pharmacy and start taking   Please continue to a heart-healthy diet and increase your physical activities. Try to exercise for at least three times a week.      It was a pleasure to see you and I look forward to continuing to work together on your health and well-being. Please do not hesitate to call the office if you need care or have questions about your care.   Have a wonderful day and week. With Gratitude, Gilmore Laroche MSN, FNP-BC

## 2022-04-01 LAB — CMP14+EGFR
ALT: 33 IU/L (ref 0–44)
AST: 30 IU/L (ref 0–40)
Albumin/Globulin Ratio: 1.8 (ref 1.2–2.2)
Albumin: 4.6 g/dL (ref 4.1–5.1)
Alkaline Phosphatase: 83 IU/L (ref 44–121)
BUN/Creatinine Ratio: 11 (ref 9–20)
BUN: 11 mg/dL (ref 6–24)
Bilirubin Total: 0.5 mg/dL (ref 0.0–1.2)
CO2: 22 mmol/L (ref 20–29)
Calcium: 9.5 mg/dL (ref 8.7–10.2)
Chloride: 102 mmol/L (ref 96–106)
Creatinine, Ser: 1.03 mg/dL (ref 0.76–1.27)
Globulin, Total: 2.6 g/dL (ref 1.5–4.5)
Glucose: 101 mg/dL — ABNORMAL HIGH (ref 70–99)
Potassium: 4 mmol/L (ref 3.5–5.2)
Sodium: 138 mmol/L (ref 134–144)
Total Protein: 7.2 g/dL (ref 6.0–8.5)
eGFR: 91 mL/min/{1.73_m2} (ref >59)

## 2022-04-01 LAB — CBC WITH DIFFERENTIAL/PLATELET
Basophils Absolute: 0 10*3/uL (ref 0.0–0.2)
Basos: 1 %
EOS (ABSOLUTE): 0.1 10*3/uL (ref 0.0–0.4)
Eos: 2 %
Hematocrit: 45 % (ref 37.5–51.0)
Hemoglobin: 15.2 g/dL (ref 13.0–17.7)
Immature Grans (Abs): 0 10*3/uL (ref 0.0–0.1)
Immature Granulocytes: 0 %
Lymphocytes Absolute: 3.2 10*3/uL — ABNORMAL HIGH (ref 0.7–3.1)
Lymphs: 42 %
MCH: 28.3 pg (ref 26.6–33.0)
MCHC: 33.8 g/dL (ref 31.5–35.7)
MCV: 84 fL (ref 79–97)
Monocytes Absolute: 0.7 10*3/uL (ref 0.1–0.9)
Monocytes: 9 %
Neutrophils Absolute: 3.5 10*3/uL (ref 1.4–7.0)
Neutrophils: 46 %
Platelets: 352 10*3/uL (ref 150–450)
RBC: 5.38 x10E6/uL (ref 4.14–5.80)
RDW: 13.6 % (ref 11.6–15.4)
WBC: 7.6 10*3/uL (ref 3.4–10.8)

## 2022-04-01 LAB — TSH+FREE T4
Free T4: 1.35 ng/dL (ref 0.82–1.77)
TSH: 3.39 u[IU]/mL (ref 0.450–4.500)

## 2022-04-01 LAB — LIPID PANEL
Chol/HDL Ratio: 3.3 ratio (ref 0.0–5.0)
Cholesterol, Total: 139 mg/dL (ref 100–199)
HDL: 42 mg/dL (ref >39)
LDL Chol Calc (NIH): 74 mg/dL (ref 0–99)
Triglycerides: 126 mg/dL (ref 0–149)
VLDL Cholesterol Cal: 23 mg/dL (ref 5–40)

## 2022-04-01 LAB — HEMOGLOBIN A1C
Est. average glucose Bld gHb Est-mCnc: 128 mg/dL
Hgb A1c MFr Bld: 6.1 % — ABNORMAL HIGH (ref 4.8–5.6)

## 2022-04-01 LAB — VITAMIN D 25 HYDROXY (VIT D DEFICIENCY, FRACTURES): Vit D, 25-Hydroxy: 25.9 ng/mL — ABNORMAL LOW (ref 30.0–100.0)

## 2022-04-10 ENCOUNTER — Other Ambulatory Visit: Payer: Self-pay | Admitting: Family Medicine

## 2022-04-10 DIAGNOSIS — E559 Vitamin D deficiency, unspecified: Secondary | ICD-10-CM

## 2022-04-10 MED ORDER — VITAMIN D (ERGOCALCIFEROL) 1.25 MG (50000 UNIT) PO CAPS: 50000.0000 [IU] | ORAL_CAPSULE | ORAL | 1 refills | Status: DC

## 2022-04-28 ENCOUNTER — Encounter: Payer: Self-pay | Admitting: Family Medicine

## 2022-04-28 ENCOUNTER — Ambulatory Visit: Payer: BC Managed Care – PPO | Admitting: Family Medicine

## 2022-04-28 VITALS — BP 138/86 | HR 85 | Ht 70.0 in | Wt 269.0 lb

## 2022-04-28 DIAGNOSIS — I10 Essential (primary) hypertension: Secondary | ICD-10-CM | POA: Diagnosis not present

## 2022-04-28 DIAGNOSIS — E669 Obesity, unspecified: Secondary | ICD-10-CM

## 2022-04-28 NOTE — Assessment & Plan Note (Signed)
Controlled Encouraged to continue taking olmesartan 40 mg daily Dash diet reviewed Encouraged low-sodium diet, with increased physical activities Will assess his CBC and CMP today BP Readings from Last 3 Encounters:  04/28/22 138/86  03/31/22 (!) 140/90  02/28/22 (!) 152/96

## 2022-04-28 NOTE — Assessment & Plan Note (Signed)
Patient would like to explore his weight loss options Inform patient that he is not a candidate for [facility], given that he is not diabetic Discussed lifestyle modification with increased physical activities and heart healthy diet Recommended reducing his portion size, reducing his sugar, sodium, and  carbohydrates intake and limiting trans and saturated fats in hs diet.  Recommended increasing his fiber intake and finding healthy to go snacks that is low in carbs, sugar and fat Recommend increasing servings of fruit and vegetables, reducing soda and limiting processed foods from his diet  Encourage increasing his physical activities Physical activity helps: Lower your blood glucose, improve your heart health, lower your blood pressure and cholesterol, burn calories to help manage her weight, gave you energy, lower stress, and improve his sleep.  The American diabetes Association (ADA) recommends being active for 2-1/2 hours (150 minutes) or more week.  Exercise for 30 minutes, 5 days a week (150  minutes total)    Will follow-up in 1 month

## 2022-04-28 NOTE — Patient Instructions (Addendum)
I appreciate the opportunity to provide care to you today!    Follow up:  1 months  I recommend implementing lifestyle changes including increasing her physical activities and a heart healthy diet Please review my weight management plan as discussed today  I recommend reducing your portion size, reducing your sugar, sodium, and  carbohydrates intake and limiting to trans and saturated fats in your diet.  I recommend increasing her fiber intake and finding healthy to go snacks that is low in carbs, sugar and fat.  I recommend increasing servings of fruit and vegetables, reducing soda and limiting processed foods from your diet  Please continue to a heart-healthy diet and increase your physical activities.   Physical activity helps: Lower your blood glucose, improve your heart health, lower your blood pressure and cholesterol, burn calories to help manage her weight, gave you energy, lower stress, and improve his sleep.  The American diabetes Association (ADA) recommends being active for 2-1/2 hours (150 minutes) or more week.  Exercise for 30 minutes, 5 days a week (150 minutes total)     It was a pleasure to see you and I look forward to continuing to work together on your health and well-being.   Please do not hesitate to call the office if you need care or have questions about your care.   Have a wonderful day and week. With Gratitude, Gilmore Laroche MSN, FNP-BC

## 2022-04-28 NOTE — Progress Notes (Signed)
Established Patient Office Visit  Subjective:  Patient ID: Chad Moyer, male    DOB: 1975-06-19  Age: 46 y.o. MRN: [id number]  CC:  Chief Complaint  Patient presents with   Follow-up    Bp follow up. Would like to discuss starting on mounjaro.     HPI Chad Moyer is a 46 y.o. male with past medical history of hypertension and obesity presents for f/u of  chronic medical conditions.  Hypertension: He takes olmesartan 40 mg daily.  He denies chest pain, palpitation, shortness of breath, headaches, and dizziness.  He reports compliance with treatment regimen.     History reviewed. No pertinent past medical history.  Past Surgical History:  Procedure Laterality Date   IR ANGIO INTRA EXTRACRAN SEL COM CAROTID INNOMINATE BILAT MOD SED  11/29/2018   IR ANGIO VERTEBRAL SEL VERTEBRAL BILAT MOD SED  11/29/2018    Family History  Problem Relation Age of Onset   Cancer Mother        breast   Stroke Paternal Aunt     Social History   Socioeconomic History   Marital status: Married    Spouse name: Not on file   Number of children: Not on file   Years of education: Not on file   Highest education level: Not on file  Occupational History   Not on file  Tobacco Use   Smoking status: Some Days    Packs/day: 0.50    Types: Cigarettes   Smokeless tobacco: Never   Tobacco comments:    3 cig a day some days.  Vaping Use   Vaping Use: Never used  Substance and Sexual Activity   Alcohol use: Yes    Comment: occasionally   Drug use: No   Sexual activity: Yes    Birth control/protection: None  Other Topics Concern   Not on file  Social History Narrative   Not on file   Social Determinants of Health   Financial Resource Strain: Not on file  Food Insecurity: Not on file  Transportation Needs: Not on file  Physical Activity: Not on file  Stress: Not on file  Social Connections: Not on file  Intimate Partner Violence: Not on file    Outpatient  Medications Prior to Visit  Medication Sig Dispense Refill   Multiple Vitamin (MULTIVITAMIN WITH MINERALS) TABS tablet Take 1 tablet by mouth daily.     olmesartan (BENICAR) 40 MG tablet Take 1 tablet (40 mg total) by mouth daily. 30 tablet 1   Vitamin D, Ergocalciferol, (DRISDOL) 1.25 MG (50000 UNIT) CAPS capsule Take 1 capsule (50,000 Units total) by mouth every 7 (seven) days. 12 capsule 1   No facility-administered medications prior to visit.    No Known Allergies  ROS Review of Systems  Constitutional:  Negative for fatigue and fever.  Eyes:  Negative for visual disturbance.  Respiratory:  Negative for chest tightness and shortness of breath.   Cardiovascular:  Negative for chest pain and palpitations.  Neurological:  Negative for dizziness and headaches.      Objective:    Physical Exam Constitutional:      Appearance: He is obese.  HENT:     Head: Normocephalic.     Right Ear: External ear normal.     Left Ear: External ear normal.  Cardiovascular:     Rate and Rhythm: Regular rhythm.     Heart sounds: No murmur heard. Pulmonary:     Effort: No respiratory distress.  Breath sounds: Normal breath sounds.  Neurological:     Mental Status: He is alert.     BP 138/86 (BP Location: Left Arm)   Pulse 85   Ht _0  (1.778 m)   Wt 269 lb 0.6 oz (122 kg)   SpO2 94%   BMI 38.60 kg/m  Wt Readings from Last 3 Encounters:  04/28/22 269 lb 0.6 oz (122 kg)  03/31/22 272 lb 0.6 oz (123.4 kg)  02/28/22 273 lb 1.9 oz (123.9 kg)    Lab Results  Component Value Date   TSH 3.390 03/31/2022   Lab Results  Component Value Date   WBC 7.6 03/31/2022   HGB 15.2 03/31/2022   HCT 45.0 03/31/2022   MCV 84 03/31/2022   PLT 352 03/31/2022   Lab Results  Component Value Date   NA 138 03/31/2022   K 4.0 03/31/2022   CO2 22 03/31/2022   GLUCOSE 101 (H) 03/31/2022   BUN 11 03/31/2022   CREATININE 1.03 03/31/2022   BILITOT 0.5 03/31/2022   ALKPHOS 83 03/31/2022    AST 30 03/31/2022   ALT 33 03/31/2022   PROT 7.2 03/31/2022   ALBUMIN 4.6 03/31/2022   CALCIUM 9.5 03/31/2022   ANIONGAP 10 11/28/2018   EGFR 91 03/31/2022   Lab Results  Component Value Date   CHOL 139 03/31/2022   Lab Results  Component Value Date   HDL 42 03/31/2022   Lab Results  Component Value Date   LDLCALC 74 03/31/2022   Lab Results  Component Value Date   TRIG 126 03/31/2022   Lab Results  Component Value Date   CHOLHDL 3.3 03/31/2022   Lab Results  Component Value Date   HGBA1C 6.1 (H) 03/31/2022      Assessment & Plan:  Primary hypertension Assessment & Plan: Controlled Encouraged to continue taking olmesartan 40 mg daily Dash diet reviewed Encouraged low-sodium diet, with increased physical activities Will assess his CBC and CMP today BP Readings from Last 3 Encounters:  04/28/22 138/86  03/31/22 (!) 140/90  02/28/22 (!) 152/96       Obesity (BMI 30-39.9) Assessment & Plan: Patient would like to explore his weight loss options Inform patient that he is not a candidate for [facility], given that he is not diabetic Discussed lifestyle modification with increased physical activities and heart healthy diet Recommended reducing his portion size, reducing his sugar, sodium, and  carbohydrates intake and limiting trans and saturated fats in hs diet.  Recommended increasing his fiber intake and finding healthy to go snacks that is low in carbs, sugar and fat Recommend increasing servings of fruit and vegetables, reducing soda and limiting processed foods from his diet  Encourage increasing his physical activities Physical activity helps: Lower your blood glucose, improve your heart health, lower your blood pressure and cholesterol, burn calories to help manage her weight, gave you energy, lower stress, and improve his sleep.  The American diabetes Association (ADA) recommends being active for 2-1/2 hours (150 minutes) or more week.  Exercise for 30  minutes, 5 days a week (150  minutes total)    Will follow-up in 1 month     Follow-up: Return in about 1 month (around 05/29/2022) for obesity.   Alvira Monday, FNP

## 2022-05-31 ENCOUNTER — Encounter: Payer: Self-pay | Admitting: Family Medicine

## 2022-05-31 ENCOUNTER — Ambulatory Visit: Payer: BC Managed Care – PPO | Admitting: Family Medicine

## 2022-05-31 VITALS — BP 138/82 | HR 82 | Ht 70.0 in | Wt 265.1 lb

## 2022-05-31 DIAGNOSIS — I10 Essential (primary) hypertension: Secondary | ICD-10-CM

## 2022-05-31 DIAGNOSIS — E669 Obesity, unspecified: Secondary | ICD-10-CM

## 2022-05-31 MED ORDER — OLMESARTAN MEDOXOMIL 40 MG PO TABS: 40.0000 mg | ORAL_TABLET | Freq: Every day | ORAL | 1 refills | Status: DC

## 2022-05-31 NOTE — Patient Instructions (Addendum)
I appreciate the opportunity to provide care to you today!    Follow up:  3 months  Labs: Next visit  Please pick up your refills at the pharmacy Please continue low-sodium diet with increased physical activities, as you have been  Physical activity helps: Lower your blood glucose, improve your heart health, lower your blood pressure and cholesterol, burn calories to help manage her weight, gave you energy, lower stress, and improve his sleep.  The American diabetes Association (ADA) recommends being active for 2-1/2 hours (150 minutes) or more week.  Exercise for 30 minutes, 5 days a week (150 minutes total)      Please continue to a heart-healthy diet and increase your physical activities. Try to exercise for 51mins at least five times a week.      It was a pleasure to see you and I look forward to continuing to work together on your health and well-being. Please do not hesitate to call the office if you need care or have questions about your care.   Have a wonderful day and week. With Gratitude, Alvira Monday MSN, FNP-BC

## 2022-05-31 NOTE — Assessment & Plan Note (Signed)
Reports increased physical activities The patient has lost 7 pounds since 03/31/2022 Encouraged to keep up the good work [facility] Readings from Last 3 Encounters:  05/31/22 265 lb 1.9 oz (120.3 kg)  04/28/22 269 lb 0.6 oz (122 kg)  03/31/22 272 lb 0.6 oz (123.4 kg)

## 2022-05-31 NOTE — Progress Notes (Signed)
Established Patient Office Visit  Subjective:  Patient ID: Chad Moyer, male    DOB: 20-Mar-1976  Age: 47 y.o. MRN: [id number]  CC:  Chief Complaint  Patient presents with   Follow-up    1 month f/u    HPI Chad Moyer is a 47 y.o. male with past medical history of primary hypertension, prediabetes, obesity, and headaches presents for f/u of  chronic medical conditions. For the details of today's visit, please refer to the assessment and plan.     History reviewed. No pertinent past medical history.  Past Surgical History:  Procedure Laterality Date   IR ANGIO INTRA EXTRACRAN SEL COM CAROTID INNOMINATE BILAT MOD SED  11/29/2018   IR ANGIO VERTEBRAL SEL VERTEBRAL BILAT MOD SED  11/29/2018    Family History  Problem Relation Age of Onset   Cancer Mother        breast   Stroke Paternal Aunt     Social History   Socioeconomic History   Marital status: Married    Spouse name: Not on file   Number of children: Not on file   Years of education: Not on file   Highest education level: Not on file  Occupational History   Not on file  Tobacco Use   Smoking status: Some Days    Packs/day: 0.50    Types: Cigarettes   Smokeless tobacco: Never   Tobacco comments:    3 cig a day some days.  Vaping Use   Vaping Use: Never used  Substance and Sexual Activity   Alcohol use: Yes    Comment: occasionally   Drug use: No   Sexual activity: Yes    Birth control/protection: None  Other Topics Concern   Not on file  Social History Narrative   Not on file   Social Determinants of Health   Financial Resource Strain: Not on file  Food Insecurity: Not on file  Transportation Needs: Not on file  Physical Activity: Not on file  Stress: Not on file  Social Connections: Not on file  Intimate Partner Violence: Not on file    Outpatient Medications Prior to Visit  Medication Sig Dispense Refill   Multiple Vitamin (MULTIVITAMIN WITH MINERALS) TABS tablet Take 1  tablet by mouth daily.     Vitamin D, Ergocalciferol, (DRISDOL) 1.25 MG (50000 UNIT) CAPS capsule Take 1 capsule (50,000 Units total) by mouth every 7 (seven) days. 12 capsule 1   olmesartan (BENICAR) 40 MG tablet Take 1 tablet (40 mg total) by mouth daily. 30 tablet 1   No facility-administered medications prior to visit.    No Known Allergies  ROS Review of Systems  Constitutional:  Negative for fatigue and fever.  Eyes:  Negative for visual disturbance.  Respiratory:  Negative for chest tightness and shortness of breath.   Cardiovascular:  Negative for chest pain and palpitations.  Neurological:  Negative for dizziness and headaches.      Objective:    Physical Exam HENT:     Head: Normocephalic.     Right Ear: External ear normal.     Left Ear: External ear normal.     Nose: No congestion or rhinorrhea.     Mouth/Throat:     Mouth: Mucous membranes are moist.  Cardiovascular:     Rate and Rhythm: Regular rhythm.     Heart sounds: No murmur heard. Pulmonary:     Effort: No respiratory distress.     Breath sounds: Normal breath sounds.  Neurological:  Mental Status: He is alert.     BP (!) 142/90   Pulse 82   Ht 5\' 10"  (1.778 m)   Wt 265 lb 1.9 oz (120.3 kg)   SpO2 95%   BMI 38.04 kg/m  Wt Readings from Last 3 Encounters:  05/31/22 265 lb 1.9 oz (120.3 kg)  04/28/22 269 lb 0.6 oz (122 kg)  03/31/22 272 lb 0.6 oz (123.4 kg)    Lab Results  Component Value Date   TSH 3.390 03/31/2022   Lab Results  Component Value Date   WBC 7.6 03/31/2022   HGB 15.2 03/31/2022   HCT 45.0 03/31/2022   MCV 84 03/31/2022   PLT 352 03/31/2022   Lab Results  Component Value Date   NA 138 03/31/2022   K 4.0 03/31/2022   CO2 22 03/31/2022   GLUCOSE 101 (H) 03/31/2022   BUN 11 03/31/2022   CREATININE 1.03 03/31/2022   BILITOT 0.5 03/31/2022   ALKPHOS 83 03/31/2022   AST 30 03/31/2022   ALT 33 03/31/2022   PROT 7.2 03/31/2022   ALBUMIN 4.6 03/31/2022   CALCIUM  9.5 03/31/2022   ANIONGAP 10 11/28/2018   EGFR 91 03/31/2022   Lab Results  Component Value Date   CHOL 139 03/31/2022   Lab Results  Component Value Date   HDL 42 03/31/2022   Lab Results  Component Value Date   LDLCALC 74 03/31/2022   Lab Results  Component Value Date   TRIG 126 03/31/2022   Lab Results  Component Value Date   CHOLHDL 3.3 03/31/2022   Lab Results  Component Value Date   HGBA1C 6.1 (H) 03/31/2022      Assessment & Plan:  Primary hypertension Assessment & Plan: Uncontrolled He reports being out of his blood pressure medication for a week Patient is asymptomatic Reports increased physical activities and adherence with a heart healthy diet Refill of olmesartan 40 mg sent to the pharmacy Encourage patient to continue treatment regimen Will assess CBC and CMP at his next visit   Orders: -     Olmesartan Medoxomil; Take 1 tablet (40 mg total) by mouth daily.  Dispense: 90 tablet; Refill: 1  Obesity (BMI 30-39.9) Assessment & Plan: Reports increased physical activities The patient has lost 7 pounds since 03/31/2022 Encouraged to keep up the good work [facility] Readings from Last 3 Encounters:  05/31/22 265 lb 1.9 oz (120.3 kg)  04/28/22 269 lb 0.6 oz (122 kg)  03/31/22 272 lb 0.6 oz (123.4 kg)        Follow-up: Return in about 3 months (around 08/30/2022).   Chad Monday, FNP

## 2022-05-31 NOTE — Assessment & Plan Note (Signed)
Uncontrolled He reports being out of his blood pressure medication for a week Patient is asymptomatic Reports increased physical activities and adherence with a heart healthy diet Refill of olmesartan 40 mg sent to the pharmacy Encourage patient to continue treatment regimen Will assess CBC and CMP at his next visit

## 2022-06-27 ENCOUNTER — Ambulatory Visit
Admission: EM | Admit: 2022-06-27 | Discharge: 2022-06-27 | Disposition: A | Discharge status: Home / Self Care | Payer: BC Managed Care – PPO | Attending: Nurse Practitioner | Admitting: Nurse Practitioner

## 2022-06-27 DIAGNOSIS — Z1152 Encounter for screening for COVID-19: Secondary | ICD-10-CM | POA: Diagnosis not present

## 2022-06-27 DIAGNOSIS — J069 Acute upper respiratory infection, unspecified: Secondary | ICD-10-CM | POA: Insufficient documentation

## 2022-06-27 NOTE — Discharge Instructions (Addendum)
You have a viral upper respiratory infection.  Symptoms should improve over the next week to 10 days.  If you develop chest pain or shortness of breath, go to the emergency room.  We have tested you today for COVID-19.  You will see the results in Mychart and we will call you with positive results.  Please stay home and isolate until you are aware of the results.    Some things that can make you feel better are: - Increased rest - Increasing fluid with water/sugar free electrolytes - Acetaminophen and ibuprofen as needed for fever/pain - Salt water gargling, chloraseptic spray and throat lozenges - OTC guaifenesin (Mucinex) 600 mg twice daily for congestion - Saline sinus flushes or a neti pot for congestion - Humidifying the air

## 2022-06-27 NOTE — ED Triage Notes (Signed)
Cough, runny nose, nausea, that started Saturday. Taking coricidin.

## 2022-06-27 NOTE — ED Provider Notes (Signed)
[facility]    CSN: [id number] Arrival date & time: 06/27/22  1456      History   Chief Complaint Chief Complaint  Patient presents with   Cough    HPI Chad Moyer is a 47 y.o. male.   Patient presents today for 3 days of sporadic cough, nasal congestion and runny nose, sneezing, decreased appetite, and fatigue.  He denies fever, body aches, chills, shortness of breath or chest pain, chest tightness, postnasal drainage, sore throat, headache, ear pain, abdominal pain, nausea/vomiting, diarrhea, loss of taste or smell.  No known sick contacts.  Has been taking Coricidin which helps minimally with symptoms.    History reviewed. No pertinent past medical history.  Patient Active Problem List   Diagnosis Date Noted   Primary hypertension 02/28/2022   Encounter for general adult medical examination with abnormal findings 02/28/2022   Encounter to establish care 08/24/2021   Headache 11/27/2018   Prediabetes 02/21/2018   Hypertriglyceridemia 10/04/2016   Obesity (BMI 30-39.9) 06/24/2016   Elevated blood pressure reading in office without diagnosis of hypertension 06/24/2016    Past Surgical History:  Procedure Laterality Date   IR ANGIO INTRA EXTRACRAN SEL COM CAROTID INNOMINATE BILAT MOD SED  11/29/2018   IR ANGIO VERTEBRAL SEL VERTEBRAL BILAT MOD SED  11/29/2018       Home Medications    Prior to Admission medications   Medication Sig Start Date End Date Taking? Authorizing Provider  Multiple Vitamin (MULTIVITAMIN WITH MINERALS) TABS tablet Take 1 tablet by mouth daily.   Yes [provider]  olmesartan (BENICAR) 40 MG tablet Take 1 tablet (40 mg total) by mouth daily. 05/31/22  Yes Alvira Monday, FNP  Vitamin D, Ergocalciferol, (DRISDOL) 1.25 MG (50000 UNIT) CAPS capsule Take 1 capsule (50,000 Units total) by mouth every 7 (seven) days. 04/10/22  Yes Alvira Monday, FNP    Family History Family History  Problem Relation Age of Onset    Cancer Mother        breast   Stroke Paternal Aunt     Social History Social History   Tobacco Use   Smoking status: Some Days    Packs/day: 0.50    Types: Cigarettes   Smokeless tobacco: Never   Tobacco comments:    3 cig a day some days.  Vaping Use   Vaping Use: Never used  Substance Use Topics   Alcohol use: Yes    Comment: occasionally   Drug use: No     Allergies   Patient has no known allergies.   Review of Systems Review of Systems Per HPI  Physical Exam Triage Vital Signs ED Triage Vitals [06/27/22 1649]  Enc Vitals Group     BP (!) 147/85     Pulse Rate 69     Resp 18     Temp 98.9 F (37.2 C)     Temp Source Oral     SpO2 94 %     Weight      Height      Head Circumference      Peak Flow      Pain Score 0     Pain Loc      Pain Edu?      Excl. in [place]?    No data found.  Updated Vital Signs BP (!) 147/85 (BP Location: Right Arm)   Pulse 69   Temp 98.9 F (37.2 C) (Oral)   Resp 18   SpO2 94%   Visual  Acuity Right Eye Distance:   Left Eye Distance:   Bilateral Distance:    Right Eye Near:   Left Eye Near:    Bilateral Near:     Physical Exam Vitals and nursing note reviewed.  Constitutional:      General: He is not in acute distress.    Appearance: Normal appearance. He is not ill-appearing or toxic-appearing.  HENT:     Head: Normocephalic and atraumatic.     Right Ear: Tympanic membrane, ear canal and external ear normal.     Left Ear: Tympanic membrane, ear canal and external ear normal.     Nose: No congestion or rhinorrhea.     Mouth/Throat:     Mouth: Mucous membranes are moist.     Pharynx: Oropharynx is clear. Posterior oropharyngeal erythema present. No oropharyngeal exudate.  Eyes:     General: No scleral icterus.    Extraocular Movements: Extraocular movements intact.  Cardiovascular:     Rate and Rhythm: Normal rate and regular rhythm.  Pulmonary:     Effort: Pulmonary effort is normal. No respiratory  distress.     Breath sounds: Normal breath sounds. No wheezing, rhonchi or rales.  Abdominal:     General: Abdomen is flat. Bowel sounds are normal. There is no distension.     Palpations: Abdomen is soft.  Musculoskeletal:     Cervical back: Normal range of motion and neck supple.  Lymphadenopathy:     Cervical: No cervical adenopathy.  Skin:    General: Skin is warm and dry.     Coloration: Skin is not jaundiced or pale.     Findings: No erythema or rash.  Neurological:     Mental Status: He is alert and oriented to person, place, and time.  Psychiatric:        Behavior: Behavior is cooperative.      UC Treatments / Results  Labs (all labs ordered are listed, but only abnormal results are displayed) Labs Reviewed  SARS CORONAVIRUS 2 (TAT 6-24 HRS)    EKG   Radiology No results found.  Procedures Procedures (including critical care time)  Medications Ordered in UC Medications - No data to display  Initial Impression / Assessment and Plan / UC Course  I have reviewed the triage vital signs and the nursing notes.  Pertinent labs & imaging results that were available during my care of the patient were reviewed by me and considered in my medical decision making (see chart for details).   Patient is well-appearing, normotensive, afebrile, not tachycardic, not tachypneic, oxygenating well on room air.    1. Encounter for screening for COVID-19 2. Viral URI with cough Suspect viral etiology COVID-19 testing obtained Patient is a candidate for Paxlovid if he test positive Last GFR 03/31/22 = 91 Other supportive care discussed with patient Note given for work ER and return precautions discussed with patient  The patient was given the opportunity to ask questions.  All questions answered to their satisfaction.  The patient is in agreement to this plan.    Final Clinical Impressions(s) / UC Diagnoses   Final diagnoses:  Encounter for screening for COVID-19  Viral  URI with cough     Discharge Instructions      You have a viral upper respiratory infection.  Symptoms should improve over the next week to 10 days.  If you develop chest pain or shortness of breath, go to the emergency room.  We have tested you today for COVID-19.  You  will see the results in Mychart and we will call you with positive results.  Please stay home and isolate until you are aware of the results.    Some things that can make you feel better are: - Increased rest - Increasing fluid with water/sugar free electrolytes - Acetaminophen and ibuprofen as needed for fever/pain - Salt water gargling, chloraseptic spray and throat lozenges - OTC guaifenesin (Mucinex) 600 mg twice daily for congestion - Saline sinus flushes or a neti pot for congestion - Humidifying the air     ED Prescriptions   None    PDMP not reviewed this encounter.   Eulogio Bear, NP 06/27/22 1715

## 2022-06-28 ENCOUNTER — Telehealth (HOSPITAL_COMMUNITY): Payer: Self-pay | Admitting: Emergency Medicine

## 2022-06-28 LAB — SARS CORONAVIRUS 2 (TAT 6-24 HRS): SARS Coronavirus 2: POSITIVE — AB

## 2022-06-28 MED ORDER — NIRMATRELVIR/RITONAVIR (PAXLOVID)TABLET: 3.0000 | ORAL_TABLET | Freq: Two times a day (BID) | ORAL | 0 refills | Status: AC

## 2022-09-05 ENCOUNTER — Ambulatory Visit: Payer: BC Managed Care – PPO | Admitting: Family Medicine

## 2022-09-05 VITALS — BP 138/86 | HR 70 | Ht 69.0 in | Wt 275.0 lb

## 2022-09-05 DIAGNOSIS — R7301 Impaired fasting glucose: Secondary | ICD-10-CM | POA: Diagnosis not present

## 2022-09-05 DIAGNOSIS — I10 Essential (primary) hypertension: Secondary | ICD-10-CM

## 2022-09-05 DIAGNOSIS — E559 Vitamin D deficiency, unspecified: Secondary | ICD-10-CM | POA: Diagnosis not present

## 2022-09-05 DIAGNOSIS — E038 Other specified hypothyroidism: Secondary | ICD-10-CM

## 2022-09-05 DIAGNOSIS — E7849 Other hyperlipidemia: Secondary | ICD-10-CM

## 2022-09-05 DIAGNOSIS — R7303 Prediabetes: Secondary | ICD-10-CM

## 2022-09-05 DIAGNOSIS — Z1211 Encounter for screening for malignant neoplasm of colon: Secondary | ICD-10-CM | POA: Diagnosis not present

## 2022-09-05 MED ORDER — OLMESARTAN MEDOXOMIL 40 MG PO TABS
40.0000 mg | ORAL_TABLET | Freq: Every day | ORAL | 1 refills | Status: DC
Start: 2022-09-05 — End: 2023-01-12

## 2022-09-05 NOTE — Assessment & Plan Note (Signed)
Controlled He takes olmesartan 40 mg daily Asymptomatic today in the clinic Low-sodium diet with increased physical activity encouraged BP Readings from Last 3 Encounters:  09/05/22 138/86  06/27/22 (!) 147/85  05/31/22 138/82

## 2022-09-05 NOTE — Patient Instructions (Addendum)
I appreciate the opportunity to provide care to you today!    Follow up:  4 months  Labs: please stop by the lab today to get your blood drawn (CBC, CMP, TSH, Lipid profile, HgA1c, Vit D)   Please pick up your refill at the pharmacy   Please continue to a heart-healthy diet and increase your physical activities. Try to exercise for at least five days a week.      It was a pleasure to see you and I look forward to continuing to work together on your health and well-being. Please do not hesitate to call the office if you need care or have questions about your care.   Have a wonderful day and week. With Gratitude, Gilmore Laroche MSN, FNP-BC

## 2022-09-05 NOTE — Assessment & Plan Note (Signed)
Pending HgA1C Lab Results  Component Value Date   HGBA1C 6.1 (H) 03/31/2022

## 2022-09-05 NOTE — Progress Notes (Signed)
Established Patient Office Visit  Subjective:  Patient ID: Chad Moyer, male    DOB: August 06, 1975  Age: 47 y.o. MRN: [id number]  CC:  Chief Complaint  Patient presents with   Follow-up    3 month f/u.     HPI Chad Moyer is a 47 y.o. male with past medical history of hypertension and vitamin D deficiency presents for f/u of  chronic medical conditions. For the details of today's visit, please refer to the assessment and plan.     History reviewed. No pertinent past medical history.  Past Surgical History:  Procedure Laterality Date   IR ANGIO INTRA EXTRACRAN SEL COM CAROTID INNOMINATE BILAT MOD SED  11/29/2018   IR ANGIO VERTEBRAL SEL VERTEBRAL BILAT MOD SED  11/29/2018    Family History  Problem Relation Age of Onset   Cancer Mother        breast   Stroke Paternal Aunt     Social History   Socioeconomic History   Marital status: Married    Spouse name: Not on file   Number of children: Not on file   Years of education: Not on file   Highest education level: Some college, no degree  Occupational History   Not on file  Tobacco Use   Smoking status: Some Days    Packs/day: .5    Types: Cigarettes   Smokeless tobacco: Never   Tobacco comments:    3 cig a day some days.  Vaping Use   Vaping Use: Never used  Substance and Sexual Activity   Alcohol use: Yes    Comment: occasionally   Drug use: No   Sexual activity: Yes    Birth control/protection: None  Other Topics Concern   Not on file  Social History Narrative   Not on file   Social Determinants of Health   Financial Resource Strain: Low Risk  (09/05/2022)   Overall Financial Resource Strain (CARDIA)    Difficulty of Paying Living Expenses: Not very hard  Food Insecurity: No Food Insecurity (09/05/2022)   Hunger Vital Sign    Worried About Running Out of Food in the Last Year: Never true    Ran Out of Food in the Last Year: Never true  Transportation Needs: No Transportation Needs  (09/05/2022)   PRAPARE - Administrator, Civil Service (Medical): No    Lack of Transportation (Non-Medical): No  Physical Activity: Sufficiently Active (09/05/2022)   Exercise Vital Sign    Days of Exercise per Week: 4 days    Minutes of Exercise per Session: 120 min  Stress: No Stress Concern Present (09/05/2022)   [employer] of Occupational Health - Occupational Stress Questionnaire    Feeling of Stress : Not at all  Social Connections: Socially Integrated (09/05/2022)   Social Connection and Isolation Panel [NHANES]    Frequency of Communication with Friends and Family: More than three times a week    Frequency of Social Gatherings with Friends and Family: Once a week    Attends Religious Services: More than 4 times per year    Active Member of [employer] or Organizations: Yes    Attends Engineer, structural: More than 4 times per year    Marital Status: Married  Catering manager Violence: Not on file    Outpatient Medications Prior to Visit  Medication Sig Dispense Refill   Multiple Vitamin (MULTIVITAMIN WITH MINERALS) TABS tablet Take 1 tablet by mouth daily.     Vitamin D,  Ergocalciferol, (DRISDOL) 1.25 MG (50000 UNIT) CAPS capsule Take 1 capsule (50,000 Units total) by mouth every 7 (seven) days. 12 capsule 1   olmesartan (BENICAR) 40 MG tablet Take 1 tablet (40 mg total) by mouth daily. 90 tablet 1   No facility-administered medications prior to visit.    No Known Allergies  ROS Review of Systems  Constitutional:  Negative for fatigue and fever.  Eyes:  Negative for visual disturbance.  Respiratory:  Negative for chest tightness and shortness of breath.   Cardiovascular:  Negative for chest pain and palpitations.  Neurological:  Negative for dizziness and headaches.      Objective:    Physical Exam HENT:     Head: Normocephalic.     Right Ear: External ear normal.     Left Ear: External ear normal.     Nose: No congestion.      Mouth/Throat:     Mouth: Mucous membranes are moist.  Cardiovascular:     Rate and Rhythm: Regular rhythm.  Pulmonary:     Breath sounds: Normal breath sounds.  Neurological:     Mental Status: He is alert and oriented to person, place, and time.     BP 138/86 (BP Location: Left Arm)   Pulse 70   Ht  (1.753 m)   Wt 275 lb (124.7 kg)   SpO2 94%   BMI 40.61 kg/m  Wt Readings from Last 3 Encounters:  09/05/22 275 lb (124.7 kg)  05/31/22 265 lb 1.9 oz (120.3 kg)  04/28/22 269 lb 0.6 oz (122 kg)    Lab Results  Component Value Date   TSH 3.390 03/31/2022   Lab Results  Component Value Date   WBC 7.6 03/31/2022   HGB 15.2 03/31/2022   HCT 45.0 03/31/2022   MCV 84 03/31/2022   PLT 352 03/31/2022   Lab Results  Component Value Date   NA 138 03/31/2022   K 4.0 03/31/2022   CO2 22 03/31/2022   GLUCOSE 101 (H) 03/31/2022   BUN 11 03/31/2022   CREATININE 1.03 03/31/2022   BILITOT 0.5 03/31/2022   ALKPHOS 83 03/31/2022   AST 30 03/31/2022   ALT 33 03/31/2022   PROT 7.2 03/31/2022   ALBUMIN 4.6 03/31/2022   CALCIUM 9.5 03/31/2022   ANIONGAP 10 11/28/2018   EGFR 91 03/31/2022   Lab Results  Component Value Date   CHOL 139 03/31/2022   Lab Results  Component Value Date   HDL 42 03/31/2022   Lab Results  Component Value Date   LDLCALC 74 03/31/2022   Lab Results  Component Value Date   TRIG 126 03/31/2022   Lab Results  Component Value Date   CHOLHDL 3.3 03/31/2022   Lab Results  Component Value Date   HGBA1C 6.1 (H) 03/31/2022      Assessment & Plan:  Primary hypertension Assessment & Plan: Controlled He takes olmesartan 40 mg daily Asymptomatic today in the clinic Low-sodium diet with increased physical activity encouraged BP Readings from Last 3 Encounters:  09/05/22 138/86  06/27/22 (!) 147/85  05/31/22 138/82        Orders: -     CMP14+EGFR -     CBC with Differential/Platelet -     Olmesartan Medoxomil; Take 1 tablet (40 mg  total) by mouth daily.  Dispense: 90 tablet; Refill: 1  Prediabetes Assessment & Plan: Pending HgA1C Lab Results  Component Value Date   HGBA1C 6.1 (H) 03/31/2022      Colon cancer screening -  Fecal occult blood, imunochemical  IFG (impaired fasting glucose) -     Hemoglobin A1c  Vitamin D deficiency -     VITAMIN D 25 Hydroxy (Vit-D Deficiency, Fractures)  Other specified hypothyroidism -     TSH + free T4  Other hyperlipidemia -     Lipid panel   Note: This chart has been completed using Engineer, civil (consulting) software, and while attempts have been made to ensure accuracy, certain words and phrases may not be transcribed as intended.    Follow-up: Return in about 4 months (around 01/05/2023).   Gilmore Laroche, FNP

## 2022-09-06 ENCOUNTER — Other Ambulatory Visit: Payer: Self-pay | Admitting: Family Medicine

## 2022-09-06 DIAGNOSIS — E559 Vitamin D deficiency, unspecified: Secondary | ICD-10-CM

## 2022-09-06 LAB — CBC WITH DIFFERENTIAL/PLATELET
Basophils Absolute: 0 10*3/uL (ref 0.0–0.2)
Basos: 1 %
EOS (ABSOLUTE): 0.2 10*3/uL (ref 0.0–0.4)
Eos: 2 %
Hematocrit: 42.5 % (ref 37.5–51.0)
Hemoglobin: 14.3 g/dL (ref 13.0–17.7)
Immature Grans (Abs): 0 10*3/uL (ref 0.0–0.1)
Immature Granulocytes: 0 %
Lymphocytes Absolute: 3.3 10*3/uL — ABNORMAL HIGH (ref 0.7–3.1)
Lymphs: 40 %
MCH: 28.3 pg (ref 26.6–33.0)
MCHC: 33.6 g/dL (ref 31.5–35.7)
MCV: 84 fL (ref 79–97)
Monocytes Absolute: 0.7 10*3/uL (ref 0.1–0.9)
Monocytes: 8 %
Neutrophils Absolute: 4.1 10*3/uL (ref 1.4–7.0)
Neutrophils: 49 %
Platelets: 355 10*3/uL (ref 150–450)
RBC: 5.05 x10E6/uL (ref 4.14–5.80)
RDW: 14 % (ref 11.6–15.4)
WBC: 8.4 10*3/uL (ref 3.4–10.8)

## 2022-09-06 LAB — CMP14+EGFR
ALT: 35 IU/L (ref 0–44)
AST: 29 IU/L (ref 0–40)
Albumin/Globulin Ratio: 1.8 (ref 1.2–2.2)
Albumin: 4.3 g/dL (ref 4.1–5.1)
Alkaline Phosphatase: 71 IU/L (ref 44–121)
BUN/Creatinine Ratio: 12 (ref 9–20)
BUN: 12 mg/dL (ref 6–24)
Bilirubin Total: 0.2 mg/dL (ref 0.0–1.2)
CO2: 19 mmol/L — ABNORMAL LOW (ref 20–29)
Calcium: 9.6 mg/dL (ref 8.7–10.2)
Chloride: 103 mmol/L (ref 96–106)
Creatinine, Ser: 1.01 mg/dL (ref 0.76–1.27)
Globulin, Total: 2.4 g/dL (ref 1.5–4.5)
Glucose: 95 mg/dL (ref 70–99)
Potassium: 4.3 mmol/L (ref 3.5–5.2)
Sodium: 139 mmol/L (ref 134–144)
Total Protein: 6.7 g/dL (ref 6.0–8.5)
eGFR: 92 mL/min/{1.73_m2} (ref >59)

## 2022-09-06 LAB — HEMOGLOBIN A1C
Est. average glucose Bld gHb Est-mCnc: 134 mg/dL
Hgb A1c MFr Bld: 6.3 % — ABNORMAL HIGH (ref 4.8–5.6)

## 2022-09-06 LAB — LIPID PANEL
Chol/HDL Ratio: 3.6 ratio (ref 0.0–5.0)
Cholesterol, Total: 142 mg/dL (ref 100–199)
HDL: 40 mg/dL (ref >39)
LDL Chol Calc (NIH): 82 mg/dL (ref 0–99)
Triglycerides: 107 mg/dL (ref 0–149)
VLDL Cholesterol Cal: 20 mg/dL (ref 5–40)

## 2022-09-06 LAB — TSH+FREE T4
Free T4: 1.22 ng/dL (ref 0.82–1.77)
TSH: 3.32 u[IU]/mL (ref 0.450–4.500)

## 2022-09-06 LAB — VITAMIN D 25 HYDROXY (VIT D DEFICIENCY, FRACTURES): Vit D, 25-Hydroxy: 18.3 ng/mL — ABNORMAL LOW (ref 30.0–100.0)

## 2022-09-06 MED ORDER — VITAMIN D (ERGOCALCIFEROL) 1.25 MG (50000 UNIT) PO CAPS
50000.0000 [IU] | ORAL_CAPSULE | ORAL | 1 refills | Status: DC
Start: 2022-09-06 — End: 2023-01-12

## 2023-01-12 ENCOUNTER — Ambulatory Visit (INDEPENDENT_AMBULATORY_CARE_PROVIDER_SITE_OTHER): Payer: BC Managed Care – PPO | Admitting: Family Medicine

## 2023-01-12 ENCOUNTER — Encounter: Payer: Self-pay | Admitting: Family Medicine

## 2023-01-12 VITALS — BP 138/88 | Ht 69.0 in

## 2023-01-12 DIAGNOSIS — E038 Other specified hypothyroidism: Secondary | ICD-10-CM | POA: Diagnosis not present

## 2023-01-12 DIAGNOSIS — E7849 Other hyperlipidemia: Secondary | ICD-10-CM | POA: Diagnosis not present

## 2023-01-12 DIAGNOSIS — I10 Essential (primary) hypertension: Secondary | ICD-10-CM

## 2023-01-12 DIAGNOSIS — R7301 Impaired fasting glucose: Secondary | ICD-10-CM | POA: Diagnosis not present

## 2023-01-12 DIAGNOSIS — E559 Vitamin D deficiency, unspecified: Secondary | ICD-10-CM | POA: Diagnosis not present

## 2023-01-12 MED ORDER — OLMESARTAN MEDOXOMIL 40 MG PO TABS
40.0000 mg | ORAL_TABLET | Freq: Every day | ORAL | 1 refills | Status: DC
Start: 2023-01-12 — End: 2023-05-18

## 2023-01-12 MED ORDER — VITAMIN D (ERGOCALCIFEROL) 1.25 MG (50000 UNIT) PO CAPS
50000.0000 [IU] | ORAL_CAPSULE | ORAL | 1 refills | Status: DC
Start: 2023-01-12 — End: 2023-01-15

## 2023-01-12 NOTE — Patient Instructions (Signed)
I appreciate the opportunity to provide care to you today!    Follow up:  4 months  Labs: please stop by the lab today to get your blood drawn (CBC, CMP, TSH, Lipid profile, HgA1c, Vit D)   Attached with your AVS, you will find valuable resources for self-education. I highly recommend dedicating some time to thoroughly examine them.   Please continue to a heart-healthy diet and increase your physical activities. Try to exercise for 30mins at least five days a week.    It was a pleasure to see you and I look forward to continuing to work together on your health and well-being. Please do not hesitate to call the office if you need care or have questions about your care.  In case of emergency, please visit the Emergency Department for urgent care, or contact our clinic at [phone] to schedule an appointment. We're here to help you!   Have a wonderful day and week. With Gratitude, Gloria Zarwolo MSN, FNP-BC  

## 2023-01-12 NOTE — Assessment & Plan Note (Signed)
Refilled vit D

## 2023-01-12 NOTE — Assessment & Plan Note (Signed)
Controlled Asymptomatic He takes olmesartan 40 mg daily Encouraged low sodium diet with increased physical activity BP Readings from Last 3 Encounters:  01/12/23 138/88  09/05/22 138/86  06/27/22 (!) 147/85

## 2023-01-12 NOTE — Progress Notes (Signed)
Established Patient Office Visit  Subjective:  Patient ID: Chad Moyer, male    DOB: [birth year]  Age: 47 y.o. MRN: [id number]  CC:  Chief Complaint  Patient presents with   Care Management    4 month f/u    HPI Chad Moyer is a 47 y.o. male with past medical history of hypertension and vit d def presents for f/u of  chronic medical conditions. For the details of today's visit, please refer to the assessment and plan.     History reviewed. No pertinent past medical history.  Past Surgical History:  Procedure Laterality Date   IR ANGIO INTRA EXTRACRAN SEL COM CAROTID INNOMINATE BILAT MOD SED  11/29/2018   IR ANGIO VERTEBRAL SEL VERTEBRAL BILAT MOD SED  11/29/2018    Family History  Problem Relation Age of Onset   Cancer Mother        breast   Stroke Paternal Aunt     Social History   Socioeconomic History   Marital status: Married    Spouse name: Not on file   Number of children: Not on file   Years of education: Not on file   Highest education level: Some college, no degree  Occupational History   Not on file  Tobacco Use   Smoking status: Some Days    Current packs/day: 0.50    Types: Cigarettes   Smokeless tobacco: Never   Tobacco comments:    3 cig a day some days.  Vaping Use   Vaping status: Never Used  Substance and Sexual Activity   Alcohol use: Yes    Comment: occasionally   Drug use: No   Sexual activity: Yes    Birth control/protection: None  Other Topics Concern   Not on file  Social History Narrative   Not on file   Social Determinants of Health   Financial Resource Strain: Low Risk  (09/05/2022)   Overall Financial Resource Strain (CARDIA)    Difficulty of Paying Living Expenses: Not very hard  Food Insecurity: No Food Insecurity (09/05/2022)   Hunger Vital Sign    Worried About Running Out of Food in the Last Year: Never true    Ran Out of Food in the Last Year: Never true  Transportation Needs: No Transportation Needs  (09/05/2022)   PRAPARE - Administrator, Civil Service (Medical): No    Lack of Transportation (Non-Medical): No  Physical Activity: Sufficiently Active (09/05/2022)   Exercise Vital Sign    Days of Exercise per Week: 4 days    Minutes of Exercise per Session: 120 min  Stress: No Stress Concern Present (09/05/2022)   [employer] of Occupational Health - Occupational Stress Questionnaire    Feeling of Stress : Not at all  Social Connections: Socially Integrated (09/05/2022)   Social Connection and Isolation Panel [NHANES]    Frequency of Communication with Friends and Family: More than three times a week    Frequency of Social Gatherings with Friends and Family: Once a week    Attends Religious Services: More than 4 times per year    Active Member of [employer] or Organizations: Yes    Attends Engineer, structural: More than 4 times per year    Marital Status: Married  Catering manager Violence: Not on file    Outpatient Medications Prior to Visit  Medication Sig Dispense Refill   Multiple Vitamin (MULTIVITAMIN WITH MINERALS) TABS tablet Take 1 tablet by mouth daily.     olmesartan (  BENICAR) 40 MG tablet Take 1 tablet (40 mg total) by mouth daily. 90 tablet 1   Vitamin D, Ergocalciferol, (DRISDOL) 1.25 MG (50000 UNIT) CAPS capsule Take 1 capsule (50,000 Units total) by mouth every 7 (seven) days. (Patient not taking: Reported on 01/12/2023) 20 capsule 1   No facility-administered medications prior to visit.    No Known Allergies  ROS Review of Systems  Constitutional:  Negative for fatigue and fever.  Eyes:  Negative for visual disturbance.  Respiratory:  Negative for chest tightness and shortness of breath.   Cardiovascular:  Negative for chest pain and palpitations.  Neurological:  Negative for dizziness and headaches.      Objective:    Physical Exam HENT:     Head: Normocephalic.     Right Ear: External ear normal.     Left Ear: External ear normal.      Nose: No congestion or rhinorrhea.     Mouth/Throat:     Mouth: Mucous membranes are moist.  Cardiovascular:     Rate and Rhythm: Regular rhythm.     Heart sounds: No murmur heard. Pulmonary:     Effort: No respiratory distress.     Breath sounds: Normal breath sounds.  Neurological:     Mental Status: He is alert.     BP 138/88 (BP Location: Left Arm)   Ht 5\' 9"  (1.753 m)   BMI 40.61 kg/m  Wt Readings from Last 3 Encounters:  09/05/22 275 lb (124.7 kg)  05/31/22 265 lb 1.9 oz (120.3 kg)  04/28/22 269 lb 0.6 oz (122 kg)    Lab Results  Component Value Date   TSH 3.320 09/05/2022   Lab Results  Component Value Date   WBC 8.4 09/05/2022   HGB 14.3 09/05/2022   HCT 42.5 09/05/2022   MCV 84 09/05/2022   PLT 355 09/05/2022   Lab Results  Component Value Date   NA 139 09/05/2022   K 4.3 09/05/2022   CO2 19 (L) 09/05/2022   GLUCOSE 95 09/05/2022   BUN 12 09/05/2022   CREATININE 1.01 09/05/2022   BILITOT 0.2 09/05/2022   ALKPHOS 71 09/05/2022   AST 29 09/05/2022   ALT 35 09/05/2022   PROT 6.7 09/05/2022   ALBUMIN 4.3 09/05/2022   CALCIUM 9.6 09/05/2022   ANIONGAP 10 11/28/2018   EGFR 92 09/05/2022   Lab Results  Component Value Date   CHOL 142 09/05/2022   Lab Results  Component Value Date   HDL 40 09/05/2022   Lab Results  Component Value Date   LDLCALC 82 09/05/2022   Lab Results  Component Value Date   TRIG 107 09/05/2022   Lab Results  Component Value Date   CHOLHDL 3.6 09/05/2022   Lab Results  Component Value Date   HGBA1C 6.3 (H) 09/05/2022      Assessment & Plan:  Primary hypertension Assessment & Plan: Controlled Asymptomatic He takes olmesartan 40 mg daily Encouraged low sodium diet with increased physical activity BP Readings from Last 3 Encounters:  01/12/23 138/88  09/05/22 138/86  06/27/22 (!) 147/85      Orders: -     Olmesartan Medoxomil; Take 1 tablet (40 mg total) by mouth daily.  Dispense: 90 tablet;  Refill: 1  Vitamin D deficiency Assessment & Plan:  Refilled vit D  Orders: -     Vitamin D (Ergocalciferol); Take 1 capsule (50,000 Units total) by mouth every 7 (seven) days.  Dispense: 20 capsule; Refill: 1 -  VITAMIN D 25 Hydroxy (Vit-D Deficiency, Fractures)  IFG (impaired fasting glucose) -     Hemoglobin A1c  Other specified hypothyroidism -     TSH + free T4  Other hyperlipidemia -     Lipid panel -     CMP14+EGFR -     CBC with Differential/Platelet  Note: This chart has been completed using Engineer, civil (consulting) software, and while attempts have been made to ensure accuracy, certain words and phrases may not be transcribed as intended.    Follow-up: Return in about 4 months (around 05/14/2023).   Gilmore Laroche, FNP

## 2023-01-14 LAB — CBC WITH DIFFERENTIAL/PLATELET

## 2023-01-14 LAB — CMP14+EGFR
ALT: 37 IU/L (ref 0–44)
AST: 30 IU/L (ref 0–40)
Albumin: 4.2 g/dL (ref 4.1–5.1)
Alkaline Phosphatase: 68 IU/L (ref 44–121)
BUN/Creatinine Ratio: 12 (ref 9–20)
BUN: 13 mg/dL (ref 6–24)
Bilirubin Total: 0.4 mg/dL (ref 0.0–1.2)
CO2: 21 mmol/L (ref 20–29)
Calcium: 9 mg/dL (ref 8.7–10.2)
Chloride: 105 mmol/L (ref 96–106)
Creatinine, Ser: 1.05 mg/dL (ref 0.76–1.27)
Globulin, Total: 2.5 g/dL (ref 1.5–4.5)
Glucose: 97 mg/dL (ref 70–99)
Potassium: 3.9 mmol/L (ref 3.5–5.2)
Sodium: 140 mmol/L (ref 134–144)
Total Protein: 6.7 g/dL (ref 6.0–8.5)
eGFR: 88 mL/min/{1.73_m2} (ref >59)

## 2023-01-14 LAB — LIPID PANEL
Chol/HDL Ratio: 4.5 ratio (ref 0.0–5.0)
Cholesterol, Total: 145 mg/dL (ref 100–199)
HDL: 32 mg/dL — ABNORMAL LOW (ref >39)
LDL Chol Calc (NIH): 85 mg/dL (ref 0–99)
Triglycerides: 162 mg/dL — ABNORMAL HIGH (ref 0–149)
VLDL Cholesterol Cal: 28 mg/dL (ref 5–40)

## 2023-01-14 LAB — TSH+FREE T4
Free T4: 1.29 ng/dL (ref 0.82–1.77)
TSH: 2.64 u[IU]/mL (ref 0.450–4.500)

## 2023-01-14 LAB — HEMOGLOBIN A1C
Est. average glucose Bld gHb Est-mCnc: 131 mg/dL
Hgb A1c MFr Bld: 6.2 % — ABNORMAL HIGH (ref 4.8–5.6)

## 2023-01-14 LAB — VITAMIN D 25 HYDROXY (VIT D DEFICIENCY, FRACTURES): Vit D, 25-Hydroxy: 19 ng/mL — ABNORMAL LOW (ref 30.0–100.0)

## 2023-01-15 ENCOUNTER — Other Ambulatory Visit: Payer: Self-pay | Admitting: Family Medicine

## 2023-01-15 DIAGNOSIS — E559 Vitamin D deficiency, unspecified: Secondary | ICD-10-CM

## 2023-01-15 MED ORDER — VITAMIN D (ERGOCALCIFEROL) 1.25 MG (50000 UNIT) PO CAPS
50000.0000 [IU] | ORAL_CAPSULE | ORAL | 1 refills | Status: DC
Start: 2023-01-15 — End: 2023-05-18

## 2023-05-18 ENCOUNTER — Encounter: Payer: Self-pay | Admitting: Family Medicine

## 2023-05-18 ENCOUNTER — Ambulatory Visit (INDEPENDENT_AMBULATORY_CARE_PROVIDER_SITE_OTHER): Payer: BC Managed Care – PPO | Admitting: Family Medicine

## 2023-05-18 VITALS — BP 136/82 | HR 87 | Resp 16 | Ht 69.0 in | Wt 266.1 lb

## 2023-05-18 DIAGNOSIS — E559 Vitamin D deficiency, unspecified: Secondary | ICD-10-CM

## 2023-05-18 DIAGNOSIS — I10 Essential (primary) hypertension: Secondary | ICD-10-CM

## 2023-05-18 DIAGNOSIS — R7303 Prediabetes: Secondary | ICD-10-CM

## 2023-05-18 DIAGNOSIS — E7849 Other hyperlipidemia: Secondary | ICD-10-CM | POA: Diagnosis not present

## 2023-05-18 DIAGNOSIS — E038 Other specified hypothyroidism: Secondary | ICD-10-CM

## 2023-05-18 MED ORDER — VITAMIN D (ERGOCALCIFEROL) 1.25 MG (50000 UNIT) PO CAPS
50000.0000 [IU] | ORAL_CAPSULE | ORAL | 1 refills | Status: AC
Start: 2023-05-18 — End: ?

## 2023-05-18 MED ORDER — OLMESARTAN MEDOXOMIL 40 MG PO TABS
40.0000 mg | ORAL_TABLET | Freq: Every day | ORAL | 1 refills | Status: AC
Start: 2023-05-18 — End: ?

## 2023-05-18 NOTE — Assessment & Plan Note (Signed)
 Lifestyle modifications for prediabetes were discussed, including adopting a heart-healthy diet and increasing physical activity. The patient was encouraged to decrease her intake of high-sugar foods and beverages. She verbalized understanding and is aware of the plan of care.  Lab Results  Component Value Date   HGBA1C 6.2 (H) 01/12/2023

## 2023-05-18 NOTE — Assessment & Plan Note (Signed)
 Controlled Asymptomatic He takes olmesartan 40 mg daily Encouraged low sodium diet with increased physical activity BP Readings from Last 3 Encounters:  05/18/23 136/82  01/12/23 138/88  09/05/22 138/86

## 2023-05-18 NOTE — Patient Instructions (Signed)
 I appreciate the opportunity to provide care to you today!    Follow up:  4 months  Labs: please stop by the lab today to get your blood drawn (CBC, CMP, TSH, Lipid profile, HgA1c, Vit D)  For managing prediabetes, I recommend the following lifestyle changes:  Reduce Intake of High-Sugar Foods and Beverages: Limit foods and drinks high in sugar to help regulate blood sugar levels. Increase Consumption of Nutrient-Rich Foods: Focus on incorporating more fruits, vegetables, and whole grains into your diet. Choose Lean Proteins: Opt for lean proteins such as chicken, fish, beans, and legumes. Select Low-Fat Dairy Products: Choose low-fat or non-fat dairy options. Minimize Saturated Fats, Trans Fats, and Cholesterol: Reduce intake of foods high in saturated fats, trans fatty acids, and cholesterol. Engage in Regular Physical Activity: Aim for at least 30 minutes of brisk walking or other moderate activity at least 5 days a week.  Lifestyle modifications for prediabetes were discussed, including adopting a heart-healthy diet and increasing physical activity. The patient was encouraged to decrease her intake of high-sugar foods and beverages. She verbalized understanding and is aware of the plan of care.     Here are some foods to avoid or reduce in your diet to help manage cholesterol levels:  Fried Foods:Deep-fried items such as french fries, fried chicken, and fried snacks are high in unhealthy fats and can raise LDL (bad) cholesterol levels. Processed Meats:Foods like bacon, sausage, hot dogs, and deli meats are often high in saturated fat and cholesterol. Full-Fat Dairy Products:Whole milk, full-fat yogurt, butter, cream, and cheese are rich in saturated fats, which can increase cholesterol levels. Baked Goods and Sweets:Pastries, cakes, cookies, and donuts often contain trans fats and added sugars, which can raise LDL cholesterol and lower HDL (good) cholesterol. Red Meat:Beef, lamb, and  pork are high in saturated fat. Lean cuts or plant-based protein alternatives are better options. Lard and Shortening:Used in some baked goods, lard and shortening are high in trans fats and should be avoided. Fast Food:Many fast food items are cooked with unhealthy oils and contain high amounts of saturated and trans fats. Processed Snacks:Chips, crackers, and certain microwave popcorns can contain trans fats and high levels of unhealthy oils. Shellfish:While nutritious in other ways, some shellfish like shrimp, lobster, and crab are high in cholesterol. They should be consumed in moderation. Coconut and Palm Oils:these oils are high in saturated fat and can raise cholesterol levels when used in cooking or baking.   Attached with your AVS, you will find valuable resources for self-education. I highly recommend dedicating some time to thoroughly examine them.   Please continue to a heart-healthy diet and increase your physical activities. Try to exercise for at least five days a week.    It was a pleasure to see you and I look forward to continuing to work together on your health and well-being. Please do not hesitate to call the office if you need care or have questions about your care.  In case of emergency, please visit the Emergency Department for urgent care, or contact our clinic at [phone] to schedule an appointment. We're here to help you!   Have a wonderful day and week. With Gratitude, Joletta Manner MSN, FNP-BC

## 2023-05-18 NOTE — Progress Notes (Signed)
 Established Patient Office Visit  Subjective:  Patient ID: Chad Moyer, male    DOB: 04-Nov-1975  Age: 48 y.o. MRN: [id number]  CC:  Chief Complaint  Patient presents with   Hypertension    Follow up visit    HPI Chad Moyer is a 48 y.o. male with past medical history of vitamin D  deficiency, hypertension, and prediabetes presents for f/u of  chronic medical conditions. For the details of today's visit, please refer to the assessment and plan.     No past medical history on file.  Past Surgical History:  Procedure Laterality Date   IR ANGIO INTRA EXTRACRAN SEL COM CAROTID INNOMINATE BILAT MOD SED  11/29/2018   IR ANGIO VERTEBRAL SEL VERTEBRAL BILAT MOD SED  11/29/2018    Family History  Problem Relation Age of Onset   Cancer Mother        breast   Stroke Paternal Aunt     Social History   Socioeconomic History   Marital status: Married    Spouse name: Not on file   Number of children: Not on file   Years of education: Not on file   Highest education level: Some college, no degree  Occupational History   Not on file  Tobacco Use   Smoking status: Some Days    Current packs/day: 0.50    Types: Cigarettes   Smokeless tobacco: Never   Tobacco comments:    3 cig a day some days.  Vaping Use   Vaping status: Never Used  Substance and Sexual Activity   Alcohol use: Yes    Comment: occasionally   Drug use: No   Sexual activity: Yes    Birth control/protection: None  Other Topics Concern   Not on file  Social History Narrative   Not on file   Social Drivers of Health   Financial Resource Strain: Low Risk  (09/05/2022)   Overall Financial Resource Strain (CARDIA)    Difficulty of Paying Living Expenses: Not very hard  Food Insecurity: No Food Insecurity (09/05/2022)   Hunger Vital Sign    Worried About Running Out of Food in the Last Year: Never true    Ran Out of Food in the Last Year: Never true  Transportation Needs: No Transportation  Needs (09/05/2022)   PRAPARE - Administrator, Civil Service (Medical): No    Lack of Transportation (Non-Medical): No  Physical Activity: Sufficiently Active (09/05/2022)   Exercise Vital Sign    Days of Exercise per Week: 4 days    Minutes of Exercise per Session: 120 min  Stress: No Stress Concern Present (09/05/2022)   [employer] of Occupational Health - Occupational Stress Questionnaire    Feeling of Stress : Not at all  Social Connections: Socially Integrated (09/05/2022)   Social Connection and Isolation Panel [NHANES]    Frequency of Communication with Friends and Family: More than three times a week    Frequency of Social Gatherings with Friends and Family: Once a week    Attends Religious Services: More than 4 times per year    Active Member of [employer] or Organizations: Yes    Attends Engineer, Structural: More than 4 times per year    Marital Status: Married  Catering Manager Violence: Not on file    Outpatient Medications Prior to Visit  Medication Sig Dispense Refill   Multiple Vitamin (MULTIVITAMIN WITH MINERALS) TABS tablet Take 1 tablet by mouth daily.     Vitamin D ,  Ergocalciferol , (DRISDOL ) 1.25 MG (50000 UNIT) CAPS capsule Take 1 capsule (50,000 Units total) by mouth every 7 (seven) days. 20 capsule 1   olmesartan  (BENICAR ) 40 MG tablet Take 1 tablet (40 mg total) by mouth daily. (Patient not taking: Reported on 05/18/2023) 90 tablet 1   No facility-administered medications prior to visit.    No Known Allergies  ROS Review of Systems  Constitutional:  Negative for fatigue and fever.  Eyes:  Negative for visual disturbance.  Respiratory:  Negative for chest tightness and shortness of breath.   Cardiovascular:  Negative for chest pain and palpitations.  Neurological:  Negative for dizziness and headaches.      Objective:    Physical Exam HENT:     Head: Normocephalic.     Right Ear: External ear normal.     Left Ear: External ear  normal.     Nose: No congestion or rhinorrhea.     Mouth/Throat:     Mouth: Mucous membranes are moist.  Cardiovascular:     Rate and Rhythm: Regular rhythm.     Heart sounds: No murmur heard. Pulmonary:     Effort: No respiratory distress.     Breath sounds: Normal breath sounds.  Neurological:     Mental Status: He is alert.     BP 136/82   Pulse 87   Resp 16   Ht 5' 9 (1.753 m)   Wt 266 lb 1.9 oz (120.7 kg)   SpO2 93%   BMI 39.30 kg/m  Wt Readings from Last 3 Encounters:  05/18/23 266 lb 1.9 oz (120.7 kg)  09/05/22 275 lb (124.7 kg)  05/31/22 265 lb 1.9 oz (120.3 kg)    Lab Results  Component Value Date   TSH 2.640 01/12/2023   Lab Results  Component Value Date   WBC CANCELED 01/12/2023   HGB CANCELED 01/12/2023   HCT CANCELED 01/12/2023   MCV 84 09/05/2022   PLT CANCELED 01/12/2023   Lab Results  Component Value Date   NA 140 01/12/2023   K 3.9 01/12/2023   CO2 21 01/12/2023   GLUCOSE 97 01/12/2023   BUN 13 01/12/2023   CREATININE 1.05 01/12/2023   BILITOT 0.4 01/12/2023   ALKPHOS 68 01/12/2023   AST 30 01/12/2023   ALT 37 01/12/2023   PROT 6.7 01/12/2023   ALBUMIN 4.2 01/12/2023   CALCIUM 9.0 01/12/2023   ANIONGAP 10 11/28/2018   EGFR 88 01/12/2023   Lab Results  Component Value Date   CHOL 145 01/12/2023   Lab Results  Component Value Date   HDL 32 (L) 01/12/2023   Lab Results  Component Value Date   LDLCALC 85 01/12/2023   Lab Results  Component Value Date   TRIG 162 (H) 01/12/2023   Lab Results  Component Value Date   CHOLHDL 4.5 01/12/2023   Lab Results  Component Value Date   HGBA1C 6.2 (H) 01/12/2023      Assessment & Plan:  Prediabetes Assessment & Plan: Lifestyle modifications for prediabetes were discussed, including adopting a heart-healthy diet and increasing physical activity. The patient was encouraged to decrease her intake of high-sugar foods and beverages. She verbalized understanding and is aware of the  plan of care.  Lab Results  Component Value Date   HGBA1C 6.2 (H) 01/12/2023     Orders: -     Hemoglobin A1c  Primary hypertension Assessment & Plan: Controlled Asymptomatic He takes olmesartan  40 mg daily Encouraged low sodium diet with increased physical activity BP  Readings from Last 3 Encounters:  05/18/23 136/82  01/12/23 138/88  09/05/22 138/86      Orders: -     Olmesartan  Medoxomil; Take 1 tablet (40 mg total) by mouth daily.  Dispense: 90 tablet; Refill: 1  Vitamin D  deficiency Assessment & Plan: Encouraged to increase his intake of vitamin D -rich foods such as fatty fish (e.g., salmon, mackerel, and sardines), fortified dairy products, egg yolks, and fortified cereals.  RX sent Last vitamin D  Lab Results  Component Value Date   VD25OH 19.0 (L) 01/12/2023     Orders: -     Vitamin D  (Ergocalciferol ); Take 1 capsule (50,000 Units total) by mouth every 7 (seven) days.  Dispense: 20 capsule; Refill: 1 -     VITAMIN D  25 Hydroxy (Vit-D Deficiency, Fractures)  Other hyperlipidemia -     Lipid panel -     CMP14+EGFR -     CBC with Differential/Platelet  TSH (thyroid -stimulating hormone deficiency) -     TSH + free T4   Note: This chart has been completed using Engineer, Civil (consulting) software, and while attempts have been made to ensure accuracy, certain words and phrases may not be transcribed as intended.   Follow-up: Return in about 4 months (around 09/15/2023).   Milton Streicher, FNP

## 2023-05-18 NOTE — Assessment & Plan Note (Signed)
 Encouraged to increase his intake of vitamin D -rich foods such as fatty fish (e.g., salmon, mackerel, and sardines), fortified dairy products, egg yolks, and fortified cereals.  RX sent Last vitamin D  Lab Results  Component Value Date   VD25OH 19.0 (L) 01/12/2023

## 2023-09-18 ENCOUNTER — Ambulatory Visit: Payer: BC Managed Care – PPO | Admitting: Family Medicine

## 2023-11-10 ENCOUNTER — Encounter (HOSPITAL_COMMUNITY): Payer: Self-pay | Admitting: Interventional Radiology
